# Patient Record
Sex: Female | Born: 1990 | Race: White | Hispanic: No | Marital: Married | Smoking: Never smoker
Health system: Southern US, Community
[De-identification: ages and names within clinical notes are randomized; demographics above are authoritative.]

## PROBLEM LIST (undated history)

## (undated) ENCOUNTER — Inpatient Hospital Stay (HOSPITAL_COMMUNITY): Payer: Self-pay

## (undated) DIAGNOSIS — R011 Cardiac murmur, unspecified: Secondary | ICD-10-CM

## (undated) DIAGNOSIS — Z789 Other specified health status: Secondary | ICD-10-CM

## (undated) HISTORY — PX: WISDOM TOOTH EXTRACTION: SHX21

## (undated) HISTORY — PX: NO PAST SURGERIES: SHX2092

## (undated) HISTORY — DX: Other specified health status: Z78.9

---
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
## 2015-01-26 ENCOUNTER — Ambulatory Visit (INDEPENDENT_AMBULATORY_CARE_PROVIDER_SITE_OTHER): Payer: BC Managed Care – PPO | Admitting: Obstetrics & Gynecology

## 2015-01-26 ENCOUNTER — Encounter: Payer: Self-pay | Admitting: Obstetrics & Gynecology

## 2015-01-26 VITALS — BP 115/75 | HR 77 | Wt 166.0 lb

## 2015-01-26 DIAGNOSIS — Z36 Encounter for antenatal screening of mother: Secondary | ICD-10-CM | POA: Diagnosis not present

## 2015-01-26 DIAGNOSIS — Z3491 Encounter for supervision of normal pregnancy, unspecified, first trimester: Secondary | ICD-10-CM

## 2015-01-26 DIAGNOSIS — Z3480 Encounter for supervision of other normal pregnancy, unspecified trimester: Secondary | ICD-10-CM | POA: Insufficient documentation

## 2015-01-26 DIAGNOSIS — Z3401 Encounter for supervision of normal first pregnancy, first trimester: Secondary | ICD-10-CM | POA: Diagnosis not present

## 2015-01-26 DIAGNOSIS — Z3490 Encounter for supervision of normal pregnancy, unspecified, unspecified trimester: Secondary | ICD-10-CM

## 2015-01-26 LAB — OB RESULTS CONSOLE GC/CHLAMYDIA
Chlamydia: NEGATIVE
GC PROBE AMP, GENITAL: NEGATIVE

## 2015-01-26 LAB — OB RESULTS CONSOLE RPR: RPR: NONREACTIVE

## 2015-01-26 LAB — OB RESULTS CONSOLE ABO/RH: RH Type: POSITIVE

## 2015-01-26 LAB — OB RESULTS CONSOLE RUBELLA ANTIBODY, IGM: Rubella: IMMUNE

## 2015-01-26 LAB — OB RESULTS CONSOLE ANTIBODY SCREEN: Antibody Screen: NEGATIVE

## 2015-01-26 LAB — OB RESULTS CONSOLE PLATELET COUNT: Platelets: 239 10*3/uL

## 2015-01-26 LAB — OB RESULTS CONSOLE HIV ANTIBODY (ROUTINE TESTING): HIV: NONREACTIVE

## 2015-01-26 LAB — OB RESULTS CONSOLE HEPATITIS B SURFACE ANTIGEN: HEP B S AG: NEGATIVE

## 2015-01-26 LAB — OB RESULTS CONSOLE HGB/HCT, BLOOD: HEMOGLOBIN: 12 g/dL

## 2015-01-26 NOTE — Progress Notes (Signed)
Bedside US today measures [redacted]w[redacted]d fetus with heartbeat.

## 2015-01-26 NOTE — Progress Notes (Signed)
   Subjective:    Kimberly Randolph is a MW G1P0 [redacted]w[redacted]d being seen today for her first obstetrical visit.  Her obstetrical history is significant for none. Patient does intend to breast feed. Pregnancy history fully reviewed.  Patient reports no complaints.  Filed Vitals:   01/26/15 1525  BP: 115/75  Pulse: 77  Weight: 166 lb (75.297 kg)    HISTORY: OB History  Gravida Para Term Preterm AB SAB TAB Ectopic Multiple Living  1             # Outcome Date GA Lbr Len/2nd Weight Sex Delivery Anes PTL Lv  1 Current              Past Medical History  Diagnosis Date  . Medical history non-contributory    Past Surgical History  Procedure Laterality Date  . Wisdom tooth extraction     Family History  Problem Relation Age of Onset  . Cancer Mother 56    Colon Cancer  . Cancer Father 72    Bladder  . Diabetes Father     Type 2  . Hypertension Father   . Birth defects Sister infancy    Heart valve defect  . Diabetes Paternal Grandmother   . Cancer Paternal Grandfather     Pancreatic     Exam    Uterus:     Pelvic Exam:    Perineum: No Hemorrhoids   Vulva: normal   Vagina:  normal mucosa   pH:    Cervix: anteverted   Adnexa: normal adnexa   Bony Pelvis: android  System: Breast:  normal appearance, no masses or tenderness   Skin: normal coloration and turgor, no rashes    Neurologic: oriented   Extremities: normal strength, tone, and muscle mass   HEENT PERRLA   Mouth/Teeth mucous membranes moist, pharynx normal without lesions   Neck supple   Cardiovascular: regular rate and rhythm   Respiratory:  appears well, vitals normal, no respiratory distress, acyanotic, normal RR, ear and throat exam is normal, neck free of mass or lymphadenopathy, chest clear, no wheezing, crepitations, rhonchi, normal symmetric air entry   Abdomen: soft, non-tender; bowel sounds normal; no masses,  no organomegaly   Urinary: urethral meatus normal      Assessment:    Pregnancy:  G1P0 There are no active problems to display for this patient.       Plan:     Initial labs drawn. Prenatal vitamins. Problem list reviewed and updated. Genetic Screening discussed First Screen: declined.  Ultrasound discussed; fetal survey: requested.  Follow up in 4 weeks. She will join [employer] She may choose to have a Quad screen Kimberly Randolph C. 01/26/2015

## 2015-01-27 LAB — CULTURE, OB URINE
COLONY COUNT: NO GROWTH
Organism ID, Bacteria: NO GROWTH

## 2015-01-27 LAB — GC/CHLAMYDIA PROBE AMP
CT Probe RNA: NEGATIVE
GC Probe RNA: NEGATIVE

## 2015-01-28 ENCOUNTER — Telehealth: Payer: Self-pay | Admitting: *Deleted

## 2015-01-28 DIAGNOSIS — Z3491 Encounter for supervision of normal pregnancy, unspecified, first trimester: Secondary | ICD-10-CM

## 2015-01-28 NOTE — Telephone Encounter (Signed)
Updated problem list

## 2015-02-17 ENCOUNTER — Ambulatory Visit (INDEPENDENT_AMBULATORY_CARE_PROVIDER_SITE_OTHER): Payer: BC Managed Care – PPO | Admitting: Obstetrics and Gynecology

## 2015-02-17 ENCOUNTER — Encounter: Payer: Self-pay | Admitting: Obstetrics and Gynecology

## 2015-02-17 ENCOUNTER — Encounter: Payer: Self-pay | Admitting: *Deleted

## 2015-02-17 VITALS — BP 108/67 | HR 74 | Wt 166.0 lb

## 2015-02-17 DIAGNOSIS — Z23 Encounter for immunization: Secondary | ICD-10-CM | POA: Diagnosis not present

## 2015-02-17 DIAGNOSIS — Z3401 Encounter for supervision of normal first pregnancy, first trimester: Secondary | ICD-10-CM

## 2015-02-17 DIAGNOSIS — Z3491 Encounter for supervision of normal pregnancy, unspecified, first trimester: Secondary | ICD-10-CM

## 2015-02-17 NOTE — Patient Instructions (Signed)
Second Trimester of Pregnancy The second trimester is from week 13 through week 28, months 4 through 6. The second trimester is often a time when you feel your best. Your body has also adjusted to being pregnant, and you begin to feel better physically. Usually, morning sickness has lessened or quit completely, you may have more energy, and you may have an increase in appetite. The second trimester is also a time when the fetus is growing rapidly. At the end of the sixth month, the fetus is about 9 inches long and weighs about 1 pounds. You will likely begin to feel the baby move (quickening) between 18 and 20 weeks of the pregnancy. BODY CHANGES Your body goes through many changes during pregnancy. The changes vary from woman to woman.   Your weight will continue to increase. You will notice your lower abdomen bulging out.  You may begin to get stretch marks on your hips, abdomen, and breasts.  You may develop headaches that can be relieved by medicines approved by your health care provider.  You may urinate more often because the fetus is pressing on your bladder.  You may develop or continue to have heartburn as a result of your pregnancy.  You may develop constipation because certain hormones are causing the muscles that push waste through your intestines to slow down.  You may develop hemorrhoids or swollen, bulging veins (varicose veins).  You may have back pain because of the weight gain and pregnancy hormones relaxing your joints between the bones in your pelvis and as a result of a shift in weight and the muscles that support your balance.  Your breasts will continue to grow and be tender.  Your gums may bleed and may be sensitive to brushing and flossing.  Dark spots or blotches (chloasma, mask of pregnancy) may develop on your face. This will likely fade after the baby is born.  A dark line from your belly button to the pubic area (linea nigra) may appear. This will likely fade  after the baby is born.  You may have changes in your hair. These can include thickening of your hair, rapid growth, and changes in texture. Some women also have hair loss during or after pregnancy, or hair that feels dry or thin. Your hair will most likely return to normal after your baby is born. WHAT TO EXPECT AT YOUR PRENATAL VISITS During a routine prenatal visit:  You will be weighed to make sure you and the fetus are growing normally.  Your blood pressure will be taken.  Your abdomen will be measured to track your baby's growth.  The fetal heartbeat will be listened to.  Any test results from the previous visit will be discussed. Your health care provider may ask you:  How you are feeling.  If you are feeling the baby move.  If you have had any abnormal symptoms, such as leaking fluid, bleeding, severe headaches, or abdominal cramping.  If you have any questions. Other tests that may be performed during your second trimester include:  Blood tests that check for:  Low iron levels (anemia).  Gestational diabetes (between 24 and 28 weeks).  Rh antibodies.  Urine tests to check for infections, diabetes, or protein in the urine.  An ultrasound to confirm the proper growth and development of the baby.  An amniocentesis to check for possible genetic problems.  Fetal screens for spina bifida and Down syndrome. HOME CARE INSTRUCTIONS   Avoid all smoking, herbs, alcohol, and unprescribed   drugs. These chemicals affect the formation and growth of the baby.  Follow your health care provider's instructions regarding medicine use. There are medicines that are either safe or unsafe to take during pregnancy.  Exercise only as directed by your health care provider. Experiencing uterine cramps is a good sign to stop exercising.  Continue to eat regular, healthy meals.  Wear a good support bra for breast tenderness.  Do not use hot tubs, steam rooms, or saunas.  Wear your  seat belt at all times when driving.  Avoid raw meat, uncooked cheese, cat litter boxes, and soil used by cats. These carry germs that can cause birth defects in the baby.  Take your prenatal vitamins.  Try taking a stool softener (if your health care provider approves) if you develop constipation. Eat more high-fiber foods, such as fresh vegetables or fruit and whole grains. Drink plenty of fluids to keep your urine clear or pale yellow.  Take warm sitz baths to soothe any pain or discomfort caused by hemorrhoids. Use hemorrhoid cream if your health care provider approves.  If you develop varicose veins, wear support hose. Elevate your feet for 15 minutes, 3-4 times a day. Limit salt in your diet.  Avoid heavy lifting, wear low heel shoes, and practice good posture.  Rest with your legs elevated if you have leg cramps or low back pain.  Visit your dentist if you have not gone yet during your pregnancy. Use a soft toothbrush to brush your teeth and be gentle when you floss.  A sexual relationship may be continued unless your health care provider directs you otherwise.  Continue to go to all your prenatal visits as directed by your health care provider. SEEK MEDICAL CARE IF:   You have dizziness.  You have mild pelvic cramps, pelvic pressure, or nagging pain in the abdominal area.  You have persistent nausea, vomiting, or diarrhea.  You have a bad smelling vaginal discharge.  You have pain with urination. SEEK IMMEDIATE MEDICAL CARE IF:   You have a fever.  You are leaking fluid from your vagina.  You have spotting or bleeding from your vagina.  You have severe abdominal cramping or pain.  You have rapid weight gain or loss.  You have shortness of breath with chest pain.  You notice sudden or extreme swelling of your face, hands, ankles, feet, or legs.  You have not felt your baby move in over an hour.  You have severe headaches that do not go away with  medicine.  You have vision changes. Document Released: 05/23/2001 Document Revised: 06/03/2013 Document Reviewed: 07/30/2012 Exit[employer] Patient Information 2015 [employer]. This information is not intended to replace advice given to you by your health care provider. Make sure you discuss any questions you have with your health care provider.  

## 2015-02-17 NOTE — Progress Notes (Signed)
Subjective:  Kimberly Randolph is a 24 y.o. G1P0 at [redacted]w[redacted]d being seen today for ongoing prenatal care.  Patient reports no complaints. Some nausea and fatigue only.  Contractions: Not present.  Vag. Bleeding: None. Movement: Absent. Denies leaking of fluid.   The following portions of the patient's history were reviewed and updated as appropriate: allergies, current medications, past family history, past medical history, past social history, past surgical history and problem list. PN lab results not back> RN to locate.   Objective:   Filed Vitals:   02/17/15 1318  BP: 108/67  Pulse: 74  Weight: 166 lb (75.297 kg)    Fetal Status: Fetal Heart Rate (bpm): 172   Movement: Absent     General:  Alert, oriented and cooperative. Patient is in no acute distress.  Skin: Skin is warm and dry. No rash noted.   Cardiovascular: Normal heart rate noted  Respiratory: Normal respiratory effort, no problems with respiration noted  Abdomen: Soft, gravid, appropriate for gestational age. Pain/Pressure: Absent     Pelvic: Vag. Bleeding: None Vag D/C Character: Thin   Cervical exam deferred        Extremities: Normal range of motion.  Edema: None  Mental Status: Normal mood and affect. Normal behavior. Normal judgment and thought content.   Urinalysis: Urine Protein: 1+ Urine Glucose: Negative  Assessment and Plan:  Pregnancy: G1P0 at [redacted]w[redacted]d  1. 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 Common discomforts, nausea discussed. General obstetric precautions including but not limited to vaginal bleeding, contractions, leaking of fluid and fetal movement were reviewed in detail with the patient. Please refer to After Visit Summary for other counseling recommendations.  Schedule anatomic scan Return in about 1 month (around 03/19/2015).   Danae Orleans, CNM

## 2015-03-10 ENCOUNTER — Telehealth: Payer: Self-pay | Admitting: *Deleted

## 2015-03-10 NOTE — Telephone Encounter (Signed)
-----   Message from Pennie Banter sent at 03/10/2015  1:12 PM EDT ----- Regarding: nausea Rx request Patient is still having nausea and would like to try an Rx that will help with this.

## 2015-03-10 NOTE — Telephone Encounter (Signed)
Pt continues to experience nausea, would like to start medication, informed her of the options for Diclegis and Phenergan.  Pt would like to try Diclegis, will come to office and pick up samples to try and if medication works well we will call in rx to pharmacy.

## 2015-03-10 NOTE — Telephone Encounter (Signed)
Called pt, no answer, left message to call office.

## 2015-03-25 ENCOUNTER — Encounter: Payer: Self-pay | Admitting: *Deleted

## 2015-04-07 ENCOUNTER — Other Ambulatory Visit: Payer: Self-pay | Admitting: Obstetrics and Gynecology

## 2015-04-07 ENCOUNTER — Ambulatory Visit (HOSPITAL_COMMUNITY)
Admission: RE | Admit: 2015-04-07 | Discharge: 2015-04-07 | Disposition: A | Discharge status: Home / Self Care | Payer: BC Managed Care – PPO | Source: Ambulatory Visit | Attending: Obstetrics and Gynecology | Admitting: Obstetrics and Gynecology

## 2015-04-07 DIAGNOSIS — Z3A19 19 weeks gestation of pregnancy: Secondary | ICD-10-CM

## 2015-04-07 DIAGNOSIS — Z3491 Encounter for supervision of normal pregnancy, unspecified, first trimester: Secondary | ICD-10-CM

## 2015-04-07 DIAGNOSIS — Z3481 Encounter for supervision of other normal pregnancy, first trimester: Secondary | ICD-10-CM | POA: Insufficient documentation

## 2015-04-12 ENCOUNTER — Ambulatory Visit (INDEPENDENT_AMBULATORY_CARE_PROVIDER_SITE_OTHER): Payer: BC Managed Care – PPO | Admitting: Obstetrics & Gynecology

## 2015-04-12 VITALS — BP 121/71 | HR 78 | Wt 175.0 lb

## 2015-04-12 DIAGNOSIS — Z3402 Encounter for supervision of normal first pregnancy, second trimester: Secondary | ICD-10-CM

## 2015-04-12 DIAGNOSIS — Z3492 Encounter for supervision of normal pregnancy, unspecified, second trimester: Secondary | ICD-10-CM

## 2015-04-12 NOTE — Patient Instructions (Addendum)
Return to clinic for any obstetric concerns or go to [facility] for evaluation Contraception Choices Contraception (birth control) is the use of any methods or devices to prevent pregnancy. Below are some methods to help avoid pregnancy. HORMONAL METHODS   Contraceptive implant. This is a thin, plastic tube containing progesterone hormone. It does not contain estrogen hormone. Your health care provider inserts the tube in the inner part of the upper arm. The tube can remain in place for up to 3 years. After 3 years, the implant must be removed. The implant prevents the ovaries from releasing an egg (ovulation), thickens the cervical mucus to prevent sperm from entering the uterus, and thins the lining of the inside of the uterus.  Progesterone-only injections. These injections are given every 3 months by your health care provider to prevent pregnancy. This synthetic progesterone hormone stops the ovaries from releasing eggs. It also thickens cervical mucus and changes the uterine lining. This makes it harder for sperm to survive in the uterus.  Birth control pills. These pills contain estrogen and progesterone hormone. They work by preventing the ovaries from releasing eggs (ovulation). They also cause the cervical mucus to thicken, preventing the sperm from entering the uterus. Birth control pills are prescribed by a health care provider.Birth control pills can also be used to treat heavy periods.  Minipill. This type of birth control pill contains only the progesterone hormone. They are taken every day of each month and must be prescribed by your health care provider.  Birth control patch. The patch contains hormones similar to those in birth control pills. It must be changed once a week and is prescribed by a health care provider.  Vaginal ring. The ring contains hormones similar to those in birth control pills. It is left in the vagina for 3 weeks, removed for 1 week, and then a new one is put back in  place. The patient must be comfortable inserting and removing the ring from the vagina.A health care provider's prescription is necessary.  Emergency contraception. Emergency contraceptives prevent pregnancy after unprotected sexual intercourse. This pill can be taken right after sex or up to 5 days after unprotected sex. It is most effective the sooner you take the pills after having sexual intercourse. Most emergency contraceptive pills are available without a prescription. Check with your pharmacist. Do not use emergency contraception as your only form of birth control. BARRIER METHODS   Female condom. This is a thin sheath (latex or rubber) that is worn over the penis during sexual intercourse. It can be used with spermicide to increase effectiveness.  Female condom. This is a soft, loose-fitting sheath that is put into the vagina before sexual intercourse.  Diaphragm. This is a soft, latex, dome-shaped barrier that must be fitted by a health care provider. It is inserted into the vagina, along with a spermicidal jelly. It is inserted before intercourse. The diaphragm should be left in the vagina for 6 to 8 hours after intercourse.  Cervical cap. This is a round, soft, latex or plastic cup that fits over the cervix and must be fitted by a health care provider. The cap can be left in place for up to 48 hours after intercourse.  Sponge. This is a soft, circular piece of polyurethane foam. The sponge has spermicide in it. It is inserted into the vagina after wetting it and before sexual intercourse.  Spermicides. These are chemicals that kill or block sperm from entering the cervix and uterus. They come in the  form of creams, jellies, suppositories, foam, or tablets. They do not require a prescription. They are inserted into the vagina with an applicator before having sexual intercourse. The process must be repeated every time you have sexual intercourse. INTRAUTERINE CONTRACEPTION  Intrauterine  device (IUD). This is a T-shaped device that is put in a woman's uterus during a menstrual period to prevent pregnancy. There are 2 types:  Copper IUD. This type of IUD is wrapped in copper wire and is placed inside the uterus. Copper makes the uterus and fallopian tubes produce a fluid that kills sperm. It can stay in place for 10 years.  Hormone IUD. This type of IUD contains the hormone progestin (synthetic progesterone). The hormone thickens the cervical mucus and prevents sperm from entering the uterus, and it also thins the uterine lining to prevent implantation of a fertilized egg. The hormone can weaken or kill the sperm that get into the uterus. It can stay in place for 3-5 years, depending on which type of IUD is used. PERMANENT METHODS OF CONTRACEPTION  Female tubal ligation. This is when the woman's fallopian tubes are surgically sealed, tied, or blocked to prevent the egg from traveling to the uterus.  Hysteroscopic sterilization. This involves placing a small coil or insert into each fallopian tube. Your doctor uses a technique called hysteroscopy to do the procedure. The device causes scar tissue to form. This results in permanent blockage of the fallopian tubes, so the sperm cannot fertilize the egg. It takes about 3 months after the procedure for the tubes to become blocked. You must use another form of birth control for these 3 months.  Female sterilization. This is when the female has the tubes that carry sperm tied off (vasectomy).This blocks sperm from entering the vagina during sexual intercourse. After the procedure, the man can still ejaculate fluid (semen). NATURAL PLANNING METHODS  Natural family planning. This is not having sexual intercourse or using a barrier method (condom, diaphragm, cervical cap) on days the woman could become pregnant.  Calendar method. This is keeping track of the length of each menstrual cycle and identifying when you are fertile.  Ovulation method.  This is avoiding sexual intercourse during ovulation.  Symptothermal method. This is avoiding sexual intercourse during ovulation, using a thermometer and ovulation symptoms.  Post-ovulation method. This is timing sexual intercourse after you have ovulated. Regardless of which type or method of contraception you choose, it is important that you use condoms to protect against the transmission of sexually transmitted infections (STIs). Talk with your health care provider about which form of contraception is most appropriate for you.   This information is not intended to replace advice given to you by your health care provider. Make sure you discuss any questions you have with your health care provider.   Document Released: 05/29/2005 Document Revised: 06/03/2013 Document Reviewed: 11/21/2012 Elsevier Interactive Patient Education [employer].

## 2015-04-12 NOTE — Progress Notes (Signed)
Subjective:  Kimberly Randolph is a 24 y.o. G1P0 at 7693w0d being seen today for ongoing prenatal care.  Patient reports no complaints.  Contractions: Not present.  Vag. Bleeding: None. Movement: Present. Denies leaking of fluid.   The following portions of the patient's history were reviewed and updated as appropriate: allergies, current medications, past family history, past medical history, past social history, past surgical history and problem list. Problem list updated.  Objective:   Filed Vitals:   04/12/15 1413  BP: 121/71  Pulse: 78  Weight: 175 lb (79.379 kg)    Fetal Status: Fetal Heart Rate (bpm): 142 Fundal Height: 20 cm Movement: Present     General:  Alert, oriented and cooperative. Patient is in no acute distress.  Skin: Skin is warm and dry. No rash noted.   Cardiovascular: Normal heart rate noted  Respiratory: Normal respiratory effort, no problems with respiration noted  Abdomen: Soft, gravid, appropriate for gestational age. Pain/Pressure: Absent     Pelvic: Vag. Bleeding: None Vag D/C Character: Thin   Cervical exam deferred        Extremities: Normal range of motion.  Edema: None  Mental Status: Normal mood and affect. Normal behavior. Normal judgment and thought content.   Urinalysis: Urine Protein: Negative Urine Glucose: Negative  Assessment and Plan:  Pregnancy: G1P0 at 7293w0d  Supervision of normal pregnancy, second trimester Preterm labor symptoms and general obstetric precautions including but not limited to vaginal bleeding, contractions, leaking of fluid and fetal movement were reviewed in detail with the patient. Please refer to After Visit Summary for other counseling recommendations.  Return in about 8 weeks (around 06/07/2015) for 1 hr GTT, TDap, 3rd trimester labs, OB Visit (Babyscripts).   Tereso NewcomerUgonna A Theressa Piedra, MD

## 2015-04-15 ENCOUNTER — Encounter: Payer: BC Managed Care – PPO | Admitting: Obstetrics & Gynecology

## 2015-06-08 ENCOUNTER — Encounter: Payer: Self-pay | Admitting: Obstetrics & Gynecology

## 2015-06-08 ENCOUNTER — Ambulatory Visit (INDEPENDENT_AMBULATORY_CARE_PROVIDER_SITE_OTHER): Payer: BC Managed Care – PPO | Admitting: Obstetrics & Gynecology

## 2015-06-08 VITALS — BP 112/74 | HR 97 | Wt 191.0 lb

## 2015-06-08 DIAGNOSIS — Z23 Encounter for immunization: Secondary | ICD-10-CM | POA: Diagnosis not present

## 2015-06-08 DIAGNOSIS — Z3403 Encounter for supervision of normal first pregnancy, third trimester: Secondary | ICD-10-CM

## 2015-06-08 DIAGNOSIS — Z36 Encounter for antenatal screening of mother: Secondary | ICD-10-CM | POA: Diagnosis not present

## 2015-06-08 DIAGNOSIS — Z3493 Encounter for supervision of normal pregnancy, unspecified, third trimester: Secondary | ICD-10-CM

## 2015-06-08 LAB — CBC
HEMATOCRIT: 35.5 % — AB (ref 36.0–46.0)
HEMOGLOBIN: 12 g/dL (ref 12.0–15.0)
MCH: 31.1 pg (ref 26.0–34.0)
MCHC: 33.8 g/dL (ref 30.0–36.0)
MCV: 92 fL (ref 78.0–100.0)
MPV: 10.3 fL (ref 8.6–12.4)
Platelets: 213 10*3/uL (ref 150–400)
RBC: 3.86 MIL/uL — ABNORMAL LOW (ref 3.87–5.11)
RDW: 13.6 % (ref 11.5–15.5)
WBC: 11 10*3/uL — AB (ref 4.0–10.5)

## 2015-06-08 NOTE — Progress Notes (Signed)
Subjective:  Kimberly JuniperKatie Randolph is a 24 y.o. G1P0 at 6766w1d being seen today for ongoing prenatal care.  She is currently monitored for the following issues for this low-risk pregnancy and has Supervision of normal pregnancy on her problem list.  Patient reports no complaints.  Contractions: Not present. Vag. Bleeding: None.  Movement: Present. Denies leaking of fluid.   The following portions of the patient's history were reviewed and updated as appropriate: allergies, current medications, past family history, past medical history, past social history, past surgical history and problem list. Problem list updated.  Objective:   Filed Vitals:   06/08/15 1049  BP: 112/74  Pulse: 97  Weight: 191 lb (86.637 kg)    Fetal Status: Fetal Heart Rate (bpm): 146   Movement: Present     General:  Alert, oriented and cooperative. Patient is in no acute distress.  Skin: Skin is warm and dry. No rash noted.   Cardiovascular: Normal heart rate noted  Respiratory: Normal respiratory effort, no problems with respiration noted  Abdomen: Soft, gravid, appropriate for gestational age. Pain/Pressure: Absent     Pelvic: Vag. Bleeding: None Vag D/C Character: Thin   Cervical exam deferred        Extremities: Normal range of motion.  Edema: None  Mental Status: Normal mood and affect. Normal behavior. Normal judgment and thought content.   Urinalysis: Urine Protein: Negative Urine Glucose: Negative  Assessment and Plan:  Pregnancy: G1P0 at 3766w1d  1. Supervision of normal pregnancy, third trimester  - Glucose Tolerance, 1 HR (50g) w/o Fasting - HIV antibody (with reflex) - RPR - CBC - Tdap vaccine greater than or equal to 7yo IM  Preterm labor symptoms and general obstetric precautions including but not limited to vaginal bleeding, contractions, leaking of fluid and fetal movement were reviewed in detail with the patient. Please refer to After Visit Summary for other counseling recommendations.  Return in  about 4 weeks (around 07/06/2015).   Allie BossierMyra C Chania Kochanski, MD

## 2015-06-09 LAB — GLUCOSE TOLERANCE, 1 HOUR (50G) W/O FASTING: Glucose, 1 Hour GTT: 98 mg/dL (ref 70–140)

## 2015-06-09 LAB — HIV ANTIBODY (ROUTINE TESTING W REFLEX): HIV 1&2 Ab, 4th Generation: NONREACTIVE

## 2015-06-09 LAB — RPR

## 2015-06-13 NOTE — L&D Delivery Note (Addendum)
.  Operative Delivery Note  Pt had inadequate uterine activity but progressed to C/P.  Her maternal expulsive efforts however were very suboptimal.  As a result we opted to have pt labor down spontaneously Still poor effort so began 2nd stage pitocin and increaed up to 11 mu Pushing efforts imp-roved but pt definitely had exhaustion Bell vacuum was placed at +4 station MLE made, had a very tight band and poor vaginal pliability  With 2 expulsive efforts delivered  At 11:40 PM a viable female was delivered via , Vacuum Assisted.  Presentation: vertex; Position: occiput anterior; Station: +4.  Verbal consent: obtained from patient.  Risks and benefits discussed in detail.  Risks include, but are not limited to the risks of anesthesia, bleeding, infection, damage to maternal tissues, fetal cephalhematoma.  There is also the risk of inability to effect vaginal delivery of the head, or shoulder dystocia that cannot be resolved by established maneuvers, leading to the need for emergency cesarean section.  APGAR: 9, 9; weight 8 lb 9 oz (3884 g).   Placenta status: Intact, Spontaneous.   Cord: 3 vessels with the following complications: None.  Cord pH: not done  Anesthesia: Epidural  Instruments:  Episiotomy: Median Lacerations: 4th degree Suture Repair: 3.0 monocryl Est. Blood Loss (mL): 500  Mom to postpartum.  Baby to Couplet care / Skin to Skin.  Ever Gustafson H 08/26/2015, 1:16 AM

## 2015-07-05 ENCOUNTER — Encounter: Payer: Self-pay | Admitting: *Deleted

## 2015-07-05 ENCOUNTER — Ambulatory Visit (INDEPENDENT_AMBULATORY_CARE_PROVIDER_SITE_OTHER): Payer: BC Managed Care – PPO | Admitting: Family Medicine

## 2015-07-05 VITALS — BP 100/69 | HR 93 | Ht 69.0 in | Wt 197.0 lb

## 2015-07-05 DIAGNOSIS — Z3403 Encounter for supervision of normal first pregnancy, third trimester: Secondary | ICD-10-CM

## 2015-07-05 DIAGNOSIS — Z3493 Encounter for supervision of normal pregnancy, unspecified, third trimester: Secondary | ICD-10-CM

## 2015-07-05 NOTE — Progress Notes (Signed)
1/14 BP on babyscripts 107/71. Patient is compliant.

## 2015-07-05 NOTE — Progress Notes (Signed)
Subjective:  Kimberly Randolph is a 25 y.o. G1P0 at [redacted]w[redacted]d being seen today for ongoing prenatal care.  She is currently monitored for the following issues for this low-risk pregnancy and has Supervision of normal pregnancy on her problem list.  Patient reports no complaints.  Contractions: Irregular. Vag. Bleeding: None.  Movement: Present. Denies leaking of fluid.   The following portions of the patient's history were reviewed and updated as appropriate: allergies, current medications, past family history, past medical history, past social history, past surgical history and problem list. Problem list updated.  Objective:   Filed Vitals:   07/05/15 1506 07/05/15 1509  BP: 100/69   Pulse: 93   Height:   (1.753 m)  Weight: 197 lb (89.359 kg)     Fetal Status: Fetal Heart Rate (bpm): 142 Fundal Height: 32 cm Movement: Present     General:  Alert, oriented and cooperative. Patient is in no acute distress.  Skin: Skin is warm and dry. No rash noted.   Cardiovascular: Normal heart rate noted  Respiratory: Normal respiratory effort, no problems with respiration noted  Abdomen: Soft, gravid, appropriate for gestational age. Pain/Pressure: Present     Pelvic: Vag. Bleeding: None     Cervical exam deferred        Extremities: Normal range of motion.  Edema: Trace  Mental Status: Normal mood and affect. Normal behavior. Normal judgment and thought content.   Urinalysis: Urine Protein: Negative Urine Glucose: Negative  Assessment and Plan:  Pregnancy: G1P0 at [redacted]w[redacted]d  1. Supervision of normal pregnancy, third trimester Continue routine prenatal care.   Preterm labor symptoms and general obstetric precautions including but not limited to vaginal bleeding, contractions, leaking of fluid and fetal movement were reviewed in detail with the patient. Please refer to After Visit Summary for other counseling recommendations.  Return in about 4 weeks (around 08/02/2015).   Reva Bores, MD

## 2015-07-05 NOTE — Patient Instructions (Signed)
Third Trimester of Pregnancy The third trimester is from week 29 through week 42, months 7 through 9. The third trimester is a time when the fetus is growing rapidly. At the end of the ninth month, the fetus is about 20 inches in length and weighs 6-10 pounds.  BODY CHANGES Your body goes through many changes during pregnancy. The changes vary from woman to woman.   Your weight will continue to increase. You can expect to gain 25-35 pounds (11-16 kg) by the end of the pregnancy.  You may begin to get stretch marks on your hips, abdomen, and breasts.  You may urinate more often because the fetus is moving lower into your pelvis and pressing on your bladder.  You may develop or continue to have heartburn as a result of your pregnancy.  You may develop constipation because certain hormones are causing the muscles that push waste through your intestines to slow down.  You may develop hemorrhoids or swollen, bulging veins (varicose veins).  You may have pelvic pain because of the weight gain and pregnancy hormones relaxing your joints between the bones in your pelvis. Backaches may result from overexertion of the muscles supporting your posture.  You may have changes in your hair. These can include thickening of your hair, rapid growth, and changes in texture. Some women also have hair loss during or after pregnancy, or hair that feels dry or thin. Your hair will most likely return to normal after your baby is born.  Your breasts will continue to grow and be tender. A yellow discharge may leak from your breasts called colostrum.  Your belly button may stick out.  You may feel short of breath because of your expanding uterus.  You may notice the fetus "dropping," or moving lower in your abdomen.  You may have a bloody mucus discharge. This usually occurs a few days to a week before labor begins.  Your cervix becomes thin and soft (effaced) near your due date. WHAT TO EXPECT AT YOUR  PRENATAL EXAMS  You will have prenatal exams every 2 weeks until week 36. Then, you will have weekly prenatal exams. During a routine prenatal visit:  You will be weighed to make sure you and the fetus are growing normally.  Your blood pressure is taken.  Your abdomen will be measured to track your baby's growth.  The fetal heartbeat will be listened to.  Any test results from the previous visit will be discussed.  You may have a cervical check near your due date to see if you have effaced. At around 36 weeks, your caregiver will check your cervix. At the same time, your caregiver will also perform a test on the secretions of the vaginal tissue. This test is to determine if a type of bacteria, Group B streptococcus, is present. Your caregiver will explain this further. Your caregiver may ask you:  What your birth plan is.  How you are feeling.  If you are feeling the baby move.  If you have had any abnormal symptoms, such as leaking fluid, bleeding, severe headaches, or abdominal cramping.  If you are using any tobacco products, including cigarettes, chewing tobacco, and electronic cigarettes.  If you have any questions. Other tests or screenings that may be performed during your third trimester include:  Blood tests that check for low iron levels (anemia).  Fetal testing to check the health, activity level, and growth of the fetus. Testing is done if you have certain medical conditions or if   there are problems during the pregnancy.  HIV (human immunodeficiency virus) testing. If you are at high risk, you may be screened for HIV during your third trimester of pregnancy. FALSE LABOR You may feel small, irregular contractions that eventually go away. These are called Braxton Hicks contractions, or false labor. Contractions may last for hours, days, or even weeks before true labor sets in. If contractions come at regular intervals, intensify, or become painful, it is best to be seen  by your caregiver.  SIGNS OF LABOR   Menstrual-like cramps.  Contractions that are 5 minutes apart or less.  Contractions that start on the top of the uterus and spread down to the lower abdomen and back.  A sense of increased pelvic pressure or back pain.  A watery or bloody mucus discharge that comes from the vagina. If you have any of these signs before the 37th week of pregnancy, call your caregiver right away. You need to go to the hospital to get checked immediately. HOME CARE INSTRUCTIONS   Avoid all smoking, herbs, alcohol, and unprescribed drugs. These chemicals affect the formation and growth of the baby.  Do not use any tobacco products, including cigarettes, chewing tobacco, and electronic cigarettes. If you need help quitting, ask your health care provider. You may receive counseling support and other resources to help you quit.  Follow your caregiver's instructions regarding medicine use. There are medicines that are either safe or unsafe to take during pregnancy.  Exercise only as directed by your caregiver. Experiencing uterine cramps is a good sign to stop exercising.  Continue to eat regular, healthy meals.  Wear a good support bra for breast tenderness.  Do not use hot tubs, steam rooms, or saunas.  Wear your seat belt at all times when driving.  Avoid raw meat, uncooked cheese, cat litter boxes, and soil used by cats. These carry germs that can cause birth defects in the baby.  Take your prenatal vitamins.  Take 1500-2000 mg of calcium daily starting at the 20th week of pregnancy until you deliver your baby.  Try taking a stool softener (if your caregiver approves) if you develop constipation. Eat more high-fiber foods, such as fresh vegetables or fruit and whole grains. Drink plenty of fluids to keep your urine clear or pale yellow.  Take warm sitz baths to soothe any pain or discomfort caused by hemorrhoids. Use hemorrhoid cream if your caregiver  approves.  If you develop varicose veins, wear support hose. Elevate your feet for 15 minutes, 3-4 times a day. Limit salt in your diet.  Avoid heavy lifting, wear low heal shoes, and practice good posture.  Rest a lot with your legs elevated if you have leg cramps or low back pain.  Visit your dentist if you have not gone during your pregnancy. Use a soft toothbrush to brush your teeth and be gentle when you floss.  A sexual relationship may be continued unless your caregiver directs you otherwise.  Do not travel far distances unless it is absolutely necessary and only with the approval of your caregiver.  Take prenatal classes to understand, practice, and ask questions about the labor and delivery.  Make a trial run to the hospital.  Pack your hospital bag.  Prepare the baby's nursery.  Continue to go to all your prenatal visits as directed by your caregiver. SEEK MEDICAL CARE IF:  You are unsure if you are in labor or if your water has broken.  You have dizziness.  You have   mild pelvic cramps, pelvic pressure, or nagging pain in your abdominal area.  You have persistent nausea, vomiting, or diarrhea.  You have a bad smelling vaginal discharge.  You have pain with urination. SEEK IMMEDIATE MEDICAL CARE IF:   You have a fever.  You are leaking fluid from your vagina.  You have spotting or bleeding from your vagina.  You have severe abdominal cramping or pain.  You have rapid weight loss or gain.  You have shortness of breath with chest pain.  You notice sudden or extreme swelling of your face, hands, ankles, feet, or legs.  You have not felt your baby move in over an hour.  You have severe headaches that do not go away with medicine.  You have vision changes.   This information is not intended to replace advice given to you by your health care provider. Make sure you discuss any questions you have with your health care provider.   Document Released:  05/23/2001 Document Revised: 06/19/2014 Document Reviewed: 07/30/2012 Elsevier Interactive Patient Education 2016 [employer]  Breastfeeding Deciding to breastfeed is one of the best choices you can make for you and your baby. A change in hormones during pregnancy causes your breast tissue to grow and increases the number and size of your milk ducts. These hormones also allow proteins, sugars, and fats from your blood supply to make breast milk in your milk-producing glands. Hormones prevent breast milk from being released before your baby is born as well as prompt milk flow after birth. Once breastfeeding has begun, thoughts of your baby, as well as his or her sucking or crying, can stimulate the release of milk from your milk-producing glands.  BENEFITS OF BREASTFEEDING For Your Baby  Your first milk (colostrum) helps your baby's digestive system function better.  There are antibodies in your milk that help your baby fight off infections.  Your baby has a lower incidence of asthma, allergies, and sudden infant death syndrome.  The nutrients in breast milk are better for your baby than infant formulas and are designed uniquely for your baby's needs.  Breast milk improves your baby's brain development.  Your baby is less likely to develop other conditions, such as childhood obesity, asthma, or type 2 diabetes mellitus. For You  Breastfeeding helps to create a very special bond between you and your baby.  Breastfeeding is convenient. Breast milk is always available at the correct temperature and costs nothing.  Breastfeeding helps to burn calories and helps you lose the weight gained during pregnancy.  Breastfeeding makes your uterus contract to its prepregnancy size faster and slows bleeding (lochia) after you give birth.   Breastfeeding helps to lower your risk of developing type 2 diabetes mellitus, osteoporosis, and breast or ovarian cancer later in life. SIGNS THAT YOUR BABY IS  HUNGRY Early Signs of Hunger  Increased alertness or activity.  Stretching.  Movement of the head from side to side.  Movement of the head and opening of the mouth when the corner of the mouth or cheek is stroked (rooting).  Increased sucking sounds, smacking lips, cooing, sighing, or squeaking.  Hand-to-mouth movements.  Increased sucking of fingers or hands. Late Signs of Hunger  Fussing.  Intermittent crying. Extreme Signs of Hunger Signs of extreme hunger will require calming and consoling before your baby will be able to breastfeed successfully. Do not wait for the following signs of extreme hunger to occur before you initiate breastfeeding:  Restlessness.  A loud, strong cry.  Screaming.   BREASTFEEDING BASICS Breastfeeding Initiation  Find a comfortable place to sit or lie down, with your neck and back well supported.  Place a pillow or rolled up blanket under your baby to bring him or her to the level of your breast (if you are seated). Nursing pillows are specially designed to help support your arms and your baby while you breastfeed.  Make sure that your baby's abdomen is facing your abdomen.  Gently massage your breast. With your fingertips, massage from your chest wall toward your nipple in a circular motion. This encourages milk flow. You may need to continue this action during the feeding if your milk flows slowly.  Support your breast with 4 fingers underneath and your thumb above your nipple. Make sure your fingers are well away from your nipple and your baby's mouth.  Stroke your baby's lips gently with your finger or nipple.  When your baby's mouth is open wide enough, quickly bring your baby to your breast, placing your entire nipple and as much of the colored area around your nipple (areola) as possible into your baby's mouth.  More areola should be visible above your baby's upper lip than below the lower lip.  Your baby's tongue should be between his  or her lower gum and your breast.  Ensure that your baby's mouth is correctly positioned around your nipple (latched). Your baby's lips should create a seal on your breast and be turned out (everted).  It is common for your baby to suck about 2-3 minutes in order to start the flow of breast milk. Latching Teaching your baby how to latch on to your breast properly is very important. An improper latch can cause nipple pain and decreased milk supply for you and poor weight gain in your baby. Also, if your baby is not latched onto your nipple properly, he or she may swallow some air during feeding. This can make your baby fussy. Burping your baby when you switch breasts during the feeding can help to get rid of the air. However, teaching your baby to latch on properly is still the best way to prevent fussiness from swallowing air while breastfeeding. Signs that your baby has successfully latched on to your nipple:  Silent tugging or silent sucking, without causing you pain.  Swallowing heard between every 3-4 sucks.  Muscle movement above and in front of his or her ears while sucking. Signs that your baby has not successfully latched on to nipple:  Sucking sounds or smacking sounds from your baby while breastfeeding.  Nipple pain. If you think your baby has not latched on correctly, slip your finger into the corner of your baby's mouth to break the suction and place it between your baby's gums. Attempt breastfeeding initiation again. Signs of Successful Breastfeeding Signs from your baby:  A gradual decrease in the number of sucks or complete cessation of sucking.  Falling asleep.  Relaxation of his or her body.  Retention of a small amount of milk in his or her mouth.  Letting go of your breast by himself or herself. Signs from you:  Breasts that have increased in firmness, weight, and size 1-3 hours after feeding.  Breasts that are softer immediately after  breastfeeding.  Increased milk volume, as well as a change in milk consistency and color by the fifth day of breastfeeding.  Nipples that are not sore, cracked, or bleeding. Signs That Your Baby is Getting Enough Milk  Wetting at least 3 diapers in a 24-hour period.   The urine should be clear and pale yellow by age 5 days.  At least 3 stools in a 24-hour period by age 5 days. The stool should be soft and yellow.  At least 3 stools in a 24-hour period by age 7 days. The stool should be seedy and yellow.  No loss of weight greater than 10% of birth weight during the first 3 days of age.  Average weight gain of 4-7 ounces (113-198 g) per week after age 4 days.  Consistent daily weight gain by age 5 days, without weight loss after the age of 2 weeks. After a feeding, your baby may spit up a small amount. This is common. BREASTFEEDING FREQUENCY AND DURATION Frequent feeding will help you make more milk and can prevent sore nipples and breast engorgement. Breastfeed when you feel the need to reduce the fullness of your breasts or when your baby shows signs of hunger. This is called "breastfeeding on demand." Avoid introducing a pacifier to your baby while you are working to establish breastfeeding (the first 4-6 weeks after your baby is born). After this time you may choose to use a pacifier. Research has shown that pacifier use during the first year of a baby's life decreases the risk of sudden infant death syndrome (SIDS). Allow your baby to feed on each breast as long as he or she wants. Breastfeed until your baby is finished feeding. When your baby unlatches or falls asleep while feeding from the first breast, offer the second breast. Because newborns are often sleepy in the first few weeks of life, you may need to awaken your baby to get him or her to feed. Breastfeeding times will vary from baby to baby. However, the following rules can serve as a guide to help you ensure that your baby is  properly fed:  Newborns (babies 4 weeks of age or younger) may breastfeed every 1-3 hours.  Newborns should not go longer than 3 hours during the day or 5 hours during the night without breastfeeding.  You should breastfeed your baby a minimum of 8 times in a 24-hour period until you begin to introduce solid foods to your baby at around 6 months of age. BREAST MILK PUMPING Pumping and storing breast milk allows you to ensure that your baby is exclusively fed your breast milk, even at times when you are unable to breastfeed. This is especially important if you are going back to work while you are still breastfeeding or when you are not able to be present during feedings. Your lactation consultant can give you guidelines on how long it is safe to store breast milk. A breast pump is a machine that allows you to pump milk from your breast into a sterile bottle. The pumped breast milk can then be stored in a refrigerator or freezer. Some breast pumps are operated by hand, while others use electricity. Ask your lactation consultant which type will work best for you. Breast pumps can be purchased, but some hospitals and breastfeeding support groups lease breast pumps on a monthly basis. A lactation consultant can teach you how to hand express breast milk, if you prefer not to use a pump. CARING FOR YOUR BREASTS WHILE YOU BREASTFEED Nipples can become dry, cracked, and sore while breastfeeding. The following recommendations can help keep your breasts moisturized and healthy:  Avoid using soap on your nipples.  Wear a supportive bra. Although not required, special nursing bras and tank tops are designed to allow access to your   breasts for breastfeeding without taking off your entire bra or top. Avoid wearing underwire-style bras or extremely tight bras.  Air dry your nipples for 3-4minutes after each feeding.  Use only cotton bra pads to absorb leaked breast milk. Leaking of breast milk between feedings  is normal.  Use lanolin on your nipples after breastfeeding. Lanolin helps to maintain your skin's normal moisture barrier. If you use pure lanolin, you do not need to wash it off before feeding your baby again. Pure lanolin is not toxic to your baby. You may also hand express a few drops of breast milk and gently massage that milk into your nipples and allow the milk to air dry. In the first few weeks after giving birth, some women experience extremely full breasts (engorgement). Engorgement can make your breasts feel heavy, warm, and tender to the touch. Engorgement peaks within 3-5 days after you give birth. The following recommendations can help ease engorgement:  Completely empty your breasts while breastfeeding or pumping. You may want to start by applying warm, moist heat (in the shower or with warm water-soaked hand towels) just before feeding or pumping. This increases circulation and helps the milk flow. If your baby does not completely empty your breasts while breastfeeding, pump any extra milk after he or she is finished.  Wear a snug bra (nursing or regular) or tank top for 1-2 days to signal your body to slightly decrease milk production.  Apply ice packs to your breasts, unless this is too uncomfortable for you.  Make sure that your baby is latched on and positioned properly while breastfeeding. If engorgement persists after 48 hours of following these recommendations, contact your health care provider or a lactation consultant. OVERALL HEALTH CARE RECOMMENDATIONS WHILE BREASTFEEDING  Eat healthy foods. Alternate between meals and snacks, eating 3 of each per day. Because what you eat affects your breast milk, some of the foods may make your baby more irritable than usual. Avoid eating these foods if you are sure that they are negatively affecting your baby.  Drink milk, fruit juice, and water to satisfy your thirst (about 10 glasses a day).  Rest often, relax, and continue to take  your prenatal vitamins to prevent fatigue, stress, and anemia.  Continue breast self-awareness checks.  Avoid chewing and smoking tobacco. Chemicals from cigarettes that pass into breast milk and exposure to secondhand smoke may harm your baby.  Avoid alcohol and drug use, including marijuana. Some medicines that may be harmful to your baby can pass through breast milk. It is important to ask your health care provider before taking any medicine, including all over-the-counter and prescription medicine as well as vitamin and herbal supplements. It is possible to become pregnant while breastfeeding. If birth control is desired, ask your health care provider about options that will be safe for your baby. SEEK MEDICAL CARE IF:  You feel like you want to stop breastfeeding or have become frustrated with breastfeeding.  You have painful breasts or nipples.  Your nipples are cracked or bleeding.  Your breasts are red, tender, or warm.  You have a swollen area on either breast.  You have a fever or chills.  You have nausea or vomiting.  You have drainage other than breast milk from your nipples.  Your breasts do not become full before feedings by the fifth day after you give birth.  You feel sad and depressed.  Your baby is too sleepy to eat well.  Your baby is having trouble sleeping.     Your baby is wetting less than 3 diapers in a 24-hour period.  Your baby has less than 3 stools in a 24-hour period.  Your baby's skin or the white part of his or her eyes becomes yellow.   Your baby is not gaining weight by 5 days of age. SEEK IMMEDIATE MEDICAL CARE IF:  Your baby is overly tired (lethargic) and does not want to wake up and feed.  Your baby develops an unexplained fever.   This information is not intended to replace advice given to you by your health care provider. Make sure you discuss any questions you have with your health care provider.   Document Released: 05/29/2005  Document Revised: 02/17/2015 Document Reviewed: 11/20/2012 Elsevier Interactive Patient Education 2016 [employer]  

## 2015-07-06 ENCOUNTER — Encounter: Payer: BC Managed Care – PPO | Admitting: Obstetrics & Gynecology

## 2015-07-14 ENCOUNTER — Telehealth: Payer: Self-pay | Admitting: *Deleted

## 2015-07-14 DIAGNOSIS — M5431 Sciatica, right side: Secondary | ICD-10-CM

## 2015-07-14 MED ORDER — CYCLOBENZAPRINE HCL 10 MG PO TABS: 10.0000 mg | ORAL_TABLET | Freq: Three times a day (TID) | ORAL | Status: DC | PRN

## 2015-07-14 NOTE — Telephone Encounter (Signed)
Received VM from patient requesting someone to call her back, she is 33 wks and is having some pain.  Called pt back, no answer, left VM.

## 2015-07-14 NOTE — Telephone Encounter (Signed)
Pt has been experiencing right lower back pain that radiates down her buttocks for the past few days.  Has been doing intermittent heat and Tylenol with some relief.  Informed pt to continue the heat and to also get an abdominal band to help support the abdomen.  Sent Flexeril to the pharmacy per Illene Bolus, CNM order.  Advised pt on use of medication.  Pt will call back with any further concerns.

## 2015-07-23 ENCOUNTER — Other Ambulatory Visit (INDEPENDENT_AMBULATORY_CARE_PROVIDER_SITE_OTHER): Payer: BC Managed Care – PPO

## 2015-07-23 ENCOUNTER — Ambulatory Visit: Payer: BC Managed Care – PPO | Admitting: *Deleted

## 2015-07-23 VITALS — BP 134/77 | HR 81

## 2015-07-23 DIAGNOSIS — O133 Gestational [pregnancy-induced] hypertension without significant proteinuria, third trimester: Secondary | ICD-10-CM | POA: Diagnosis not present

## 2015-07-23 DIAGNOSIS — Z3A34 34 weeks gestation of pregnancy: Secondary | ICD-10-CM | POA: Diagnosis not present

## 2015-07-23 LAB — CBC
HCT: 35 % — ABNORMAL LOW (ref 36.0–46.0)
Hemoglobin: 11.6 g/dL — ABNORMAL LOW (ref 12.0–15.0)
MCH: 30.1 pg (ref 26.0–34.0)
MCHC: 33.1 g/dL (ref 30.0–36.0)
MCV: 90.7 fL (ref 78.0–100.0)
MPV: 11.3 fL (ref 8.6–12.4)
PLATELETS: 250 10*3/uL (ref 150–400)
RBC: 3.86 MIL/uL — ABNORMAL LOW (ref 3.87–5.11)
RDW: 13.2 % (ref 11.5–15.5)
WBC: 9.8 10*3/uL (ref 4.0–10.5)

## 2015-07-23 NOTE — Progress Notes (Signed)
Pt here for BP check, BP 134 77, states she is having occasional blurred vision.  CBC, CMET, and urine PR:CR ratio sent to lab.  NST done-reactive.  Pt to follow-up next Tuesday.  Pre-eclampsia precautions reviewed.

## 2015-07-24 LAB — COMPREHENSIVE METABOLIC PANEL
ALT: 13 U/L (ref 6–29)
AST: 20 U/L (ref 10–30)
Albumin: 2.9 g/dL — ABNORMAL LOW (ref 3.6–5.1)
Alkaline Phosphatase: 133 U/L — ABNORMAL HIGH (ref 33–115)
BUN: 6 mg/dL — ABNORMAL LOW (ref 7–25)
CHLORIDE: 103 mmol/L (ref 98–110)
CO2: 23 mmol/L (ref 20–31)
CREATININE: 0.67 mg/dL (ref 0.50–1.10)
Calcium: 9.1 mg/dL (ref 8.6–10.2)
Glucose, Bld: 58 mg/dL — ABNORMAL LOW (ref 65–99)
Potassium: 4.1 mmol/L (ref 3.5–5.3)
SODIUM: 136 mmol/L (ref 135–146)
Total Bilirubin: 0.4 mg/dL (ref 0.2–1.2)
Total Protein: 5.7 g/dL — ABNORMAL LOW (ref 6.1–8.1)

## 2015-07-24 LAB — PROTEIN / CREATININE RATIO, URINE
Creatinine, Urine: 48 mg/dL (ref 20–320)
Protein Creatinine Ratio: 146 mg/g creat (ref 21–161)
TOTAL PROTEIN, URINE: 7 mg/dL (ref 5–24)

## 2015-07-27 ENCOUNTER — Ambulatory Visit (INDEPENDENT_AMBULATORY_CARE_PROVIDER_SITE_OTHER): Payer: BC Managed Care – PPO | Admitting: Physician Assistant

## 2015-07-27 ENCOUNTER — Encounter: Payer: Self-pay | Admitting: Physician Assistant

## 2015-07-27 VITALS — BP 132/87 | HR 111 | Wt 206.0 lb

## 2015-07-27 DIAGNOSIS — Z3493 Encounter for supervision of normal pregnancy, unspecified, third trimester: Secondary | ICD-10-CM

## 2015-07-27 DIAGNOSIS — Z3403 Encounter for supervision of normal first pregnancy, third trimester: Secondary | ICD-10-CM

## 2015-07-27 NOTE — Patient Instructions (Signed)
Pain Relief During Labor and Delivery Everyone experiences pain differently, but labor causes severe pain for many women. The amount of pain you experience during labor and delivery depends on your pain tolerance, contraction strength, and your baby's size and position. There are many ways to prepare for and deal with the pain, including:   Taking prenatal classes to learn about labor and delivery. The more informed you are, the less anxious and afraid you may be. This can help lessen the pain.  Taking pain-relieving medicine during labor and delivery.  Learning breathing and relaxation techniques.  Taking a shower or bath.  Getting massaged.  Changing positions.  Placing an ice pack on your back. Discuss your pain control options with your health care provider during your prenatal visits.  WHAT ARE THE TWO TYPES OF PAIN-RELIEVING MEDICINES? 1. Analgesics. These are medicines that decrease pain without total loss of feeling or muscle movement. 2. Anesthetics. These are medicines that block all feeling, including pain. There can be minor side effects of both types, such as nausea, trouble concentrating, becoming sleepy, and lowering the heart rate of the baby. However, health care providers are careful to give doses that will not seriously affect the baby.  WHAT ARE THE SPECIFIC TYPES OF ANALGESICS AND ANESTHETICS? Systemic Analgesic Systemic pain medicines affect your whole body rather than focusing pain relief on the area of your body experiencing pain. This type of medicine is given either through an IV tube in your vein or by a shot (injection) into your muscle. This medicine will lessen your pain but will not stop it completely. It may also make you sleepy, but it will not make you lose consciousness.  Local Anesthetic Local anesthetic isused tonumb a small area of your body. The medicine is injected into the area of nerves that carry feeling to the vagina, vulva, or the area between  the vagina and anus (perineum).  General Anesthetic This type of medicine causes you to lose consciousness so you do not feel pain. It is usually used only in emergency situations during labor. It is given through an IV tube or face mask. Paracervical Block A paracervical block is a form of local anesthesia given during labor. Numbing medicine is injected into the right and left sides of the cervix and vagina. It helps to lessen the pain caused by contractions and stretching of the cervix. It may have to be given more than once.  Pudendal Block A pudendal block is another form of local anesthesia. It is used to relieve the pain associated with pushing or stretching of the perineum at the time of delivery. An injection is given deep through the vaginal wall into the pudendal nerve in the pelvis, numbing the perineum.  Epidural Anesthetic An epidural is an injection of numbing medicine given in the lower back and into the epidural space near your spinal cord. The epidural numbs the lower half of your body. You may be able to move your legs but will not be allowed to walk. Epidurals can be used for labor, delivery, or cesarean deliveries.  To prevent the medicine from wearing off, a small tube (catheter) may be threaded into the epidural space and taped in place to prevent it from slipping out. Medicine can then be given continuously in small doses through the tube until you deliver. Spinal Block A spinal block is similar to an epidural, but the medicine is injected into the spinal fluid, not the epidural space. A spinal block is only given   once. It starts to relieve pain quickly but lasts only 1-2 hours. Spinal blocks can also be used for cesarean deliveries.  Combined Spinal-Epidural Block Combined spinal-epidural blocks combine the benefits of both the spinal and epidural blocks. The spinal part acts quickly to relieve pain and the epidural provides continuous pain relief. Hydrotherapy Immersion in  warm water during labor may provide comfort and relaxation. It may also help to lessen pain, the use of anesthesia, and the length of labor. However, immersion in water during the delivery (water birth) may have some risk involved and studies to determine safety and risks are ongoing. If you are a healthy woman who is expecting an uncomplicated birth, talk with your health care provider to see if water birth is an option for you.    This information is not intended to replace advice given to you by your health care provider. Make sure you discuss any questions you have with your health care provider.   Document Released: 09/14/2008 Document Revised: 06/03/2013 Document Reviewed: 10/17/2012 Elsevier Interactive Patient Education 2016 [employer]  

## 2015-07-27 NOTE — Progress Notes (Signed)
Subjective:  Kimberly Randolph is a 25 y.o. G1P0 at [redacted]w[redacted]d being seen today for ongoing prenatal care.  Patient reports intermittent blurry vision.  Contractions: Not present.  Vag. Bleeding: None. Movement: Present. Denies leaking of fluid, headache, epigastric pain.  She does have bilateral lower edema swelling (trace).   The following portions of the patient's history were reviewed and updated as appropriate: allergies, current medications, past family history, past medical history, past social history, past surgical history and problem list.   Objective:   Filed Vitals:   07/27/15 1547  BP: 132/87  Pulse: 111  Weight: 206 lb (93.441 kg)    Fetal Status: Fetal Heart Rate (bpm): 145   Movement: Present     General:  Alert, oriented and cooperative. Patient is in no acute distress.  Skin: Skin is warm and dry. No rash noted.   Cardiovascular: Normal heart rate noted  Respiratory: Normal respiratory effort, no problems with respiration noted  Abdomen: Soft, gravid, appropriate for gestational age. Pain/Pressure: Present     Pelvic: Vag. Bleeding: None Vag D/C Character: Thin   Cervical exam deferred        Extremities: Normal range of motion.  Edema: Trace  Mental Status: Normal mood and affect. Normal behavior. Normal judgment and thought content.   Urinalysis: Urine Protein: Negative Urine Glucose: Negative  Assessment and Plan:  Pregnancy: G1P0 at [redacted]w[redacted]d  There are no diagnoses linked to this encounter. Preterm labor symptoms and general obstetric precautions including but not limited to vaginal bleeding, contractions, leaking of fluid and fetal movement were reviewed in detail with the patient. Please refer to After Visit Summary for other counseling recommendations.  F/u 1 week  Bertram Denver, PA-C

## 2015-07-29 ENCOUNTER — Encounter (INDEPENDENT_AMBULATORY_CARE_PROVIDER_SITE_OTHER): Payer: Self-pay | Admitting: *Deleted

## 2015-07-29 DIAGNOSIS — Z3403 Encounter for supervision of normal first pregnancy, third trimester: Secondary | ICD-10-CM

## 2015-08-02 ENCOUNTER — Encounter: Payer: Self-pay | Admitting: *Deleted

## 2015-08-03 ENCOUNTER — Ambulatory Visit (INDEPENDENT_AMBULATORY_CARE_PROVIDER_SITE_OTHER): Payer: BC Managed Care – PPO | Admitting: Obstetrics and Gynecology

## 2015-08-03 ENCOUNTER — Encounter: Payer: Self-pay | Admitting: Obstetrics and Gynecology

## 2015-08-03 VITALS — BP 118/86 | HR 112 | Wt 208.0 lb

## 2015-08-03 DIAGNOSIS — Z3493 Encounter for supervision of normal pregnancy, unspecified, third trimester: Secondary | ICD-10-CM

## 2015-08-03 DIAGNOSIS — Z36 Encounter for antenatal screening of mother: Secondary | ICD-10-CM | POA: Diagnosis not present

## 2015-08-03 DIAGNOSIS — Z113 Encounter for screening for infections with a predominantly sexual mode of transmission: Secondary | ICD-10-CM

## 2015-08-03 DIAGNOSIS — Z3403 Encounter for supervision of normal first pregnancy, third trimester: Secondary | ICD-10-CM

## 2015-08-03 LAB — OB RESULTS CONSOLE GBS: GBS: NEGATIVE

## 2015-08-03 NOTE — Progress Notes (Signed)
Subjective:  Kimberly Randolph is a 25 y.o. G1P0 at [redacted]w[redacted]d being seen today for ongoing prenatal care.  She is currently monitored for the following issues for this low-risk pregnancy and has Supervision of normal pregnancy on her problem list.  Patient reports no complaints.  Contractions: Not present. Vag. Bleeding: None.  Movement: Present. Denies leaking of fluid.   The following portions of the patient's history were reviewed and updated as appropriate: allergies, current medications, past family history, past medical history, past social history, past surgical history and problem list. Problem list updated.  Objective:   Filed Vitals:   08/03/15 0823  BP: 118/86  Pulse: 112  Weight: 208 lb (94.348 kg)    Fetal Status: Fetal Heart Rate (bpm): 146 Fundal Height: 36 cm Movement: Present  Presentation: Vertex  General:  Alert, oriented and cooperative. Patient is in no acute distress.  Skin: Skin is warm and dry. No rash noted.   Cardiovascular: Normal heart rate noted  Respiratory: Normal respiratory effort, no problems with respiration noted  Abdomen: Soft, gravid, appropriate for gestational age. Pain/Pressure: Present     Pelvic: Vag. Bleeding: None Vag D/C Character: Thin   Cervical exam performed Dilation: 1 Effacement (%): Thick Station: Ballotable  Extremities: Normal range of motion.  Edema: Mild pitting, slight indentation  Mental Status: Normal mood and affect. Normal behavior. Normal judgment and thought content.   Urinalysis: Urine Protein: Trace Urine Glucose: Negative  Assessment and Plan:  Pregnancy: G1P0 at [redacted]w[redacted]d  1. Supervision of normal pregnancy, third trimester Patient is doing well  Cultures collected today - GC/Chlamydia probe amp ([place])not at [facility] - Culture, beta strep (group b only)  Preterm labor symptoms and general obstetric precautions including but not limited to vaginal bleeding, contractions, leaking of fluid and fetal movement were reviewed in  detail with the patient. Please refer to After Visit Summary for other counseling recommendations.  No Follow-up on file.   Catalina Antigua, MD

## 2015-08-04 ENCOUNTER — Telehealth: Payer: Self-pay | Admitting: *Deleted

## 2015-08-04 LAB — GC/CHLAMYDIA PROBE AMP (~~LOC~~) NOT AT ARMC
CHLAMYDIA, DNA PROBE: NEGATIVE
NEISSERIA GONORRHEA: NEGATIVE

## 2015-08-04 NOTE — Telephone Encounter (Signed)
Received notification from baby scripts that BP 127/97 and recheck 142/92. Pt declines headache, visual changes, epigastric pain, or excessive swelling in extremities.  Labs WDL.  Spoke with Dr Macon Large about pt case, recommended to follow-up next week.  Sent email to baby scripts to change parameters of notification for BP > 150/100.

## 2015-08-05 LAB — CULTURE, BETA STREP (GROUP B ONLY)

## 2015-08-09 ENCOUNTER — Encounter (HOSPITAL_COMMUNITY): Payer: Self-pay | Admitting: *Deleted

## 2015-08-09 ENCOUNTER — Inpatient Hospital Stay (HOSPITAL_COMMUNITY)
Admission: AD | Admit: 2015-08-09 | Discharge: 2015-08-09 | Disposition: A | Discharge status: Home / Self Care | Payer: BC Managed Care – PPO | Source: Ambulatory Visit | Attending: Family Medicine | Admitting: Family Medicine

## 2015-08-09 ENCOUNTER — Ambulatory Visit (INDEPENDENT_AMBULATORY_CARE_PROVIDER_SITE_OTHER): Payer: BC Managed Care – PPO | Admitting: Obstetrics and Gynecology

## 2015-08-09 ENCOUNTER — Encounter: Payer: Self-pay | Admitting: Obstetrics and Gynecology

## 2015-08-09 VITALS — BP 135/82 | HR 118 | Wt 212.0 lb

## 2015-08-09 DIAGNOSIS — Z3493 Encounter for supervision of normal pregnancy, unspecified, third trimester: Secondary | ICD-10-CM

## 2015-08-09 DIAGNOSIS — O133 Gestational [pregnancy-induced] hypertension without significant proteinuria, third trimester: Secondary | ICD-10-CM

## 2015-08-09 DIAGNOSIS — R030 Elevated blood-pressure reading, without diagnosis of hypertension: Secondary | ICD-10-CM | POA: Diagnosis present

## 2015-08-09 DIAGNOSIS — Z880 Allergy status to penicillin: Secondary | ICD-10-CM | POA: Insufficient documentation

## 2015-08-09 DIAGNOSIS — Z3A37 37 weeks gestation of pregnancy: Secondary | ICD-10-CM | POA: Diagnosis not present

## 2015-08-09 DIAGNOSIS — Z3403 Encounter for supervision of normal first pregnancy, third trimester: Secondary | ICD-10-CM

## 2015-08-09 LAB — URINE MICROSCOPIC-ADD ON

## 2015-08-09 LAB — URINALYSIS, ROUTINE W REFLEX MICROSCOPIC
Bilirubin Urine: NEGATIVE
Glucose, UA: NEGATIVE mg/dL
Hgb urine dipstick: NEGATIVE
Ketones, ur: NEGATIVE mg/dL
Nitrite: NEGATIVE
Protein, ur: NEGATIVE mg/dL
Specific Gravity, Urine: 1.01 (ref 1.005–1.030)
pH: 6.5 (ref 5.0–8.0)

## 2015-08-09 LAB — CBC
HEMATOCRIT: 33.7 % — AB (ref 36.0–46.0)
Hemoglobin: 11.7 g/dL — ABNORMAL LOW (ref 12.0–15.0)
MCH: 30.4 pg (ref 26.0–34.0)
MCHC: 34.7 g/dL (ref 30.0–36.0)
MCV: 87.5 fL (ref 78.0–100.0)
Platelets: 200 10*3/uL (ref 150–400)
RBC: 3.85 MIL/uL — AB (ref 3.87–5.11)
RDW: 13 % (ref 11.5–15.5)
WBC: 12.9 10*3/uL — AB (ref 4.0–10.5)

## 2015-08-09 LAB — COMPREHENSIVE METABOLIC PANEL
ALK PHOS: 150 U/L — AB (ref 38–126)
ALT: 14 U/L (ref 14–54)
AST: 27 U/L (ref 15–41)
Albumin: 2.8 g/dL — ABNORMAL LOW (ref 3.5–5.0)
Anion gap: 9 (ref 5–15)
BILIRUBIN TOTAL: 0.6 mg/dL (ref 0.3–1.2)
BUN: 6 mg/dL (ref 6–20)
CALCIUM: 8.3 mg/dL — AB (ref 8.9–10.3)
CO2: 21 mmol/L — ABNORMAL LOW (ref 22–32)
CREATININE: 0.62 mg/dL (ref 0.44–1.00)
Chloride: 108 mmol/L (ref 101–111)
Glucose, Bld: 79 mg/dL (ref 65–99)
Potassium: 4.1 mmol/L (ref 3.5–5.1)
Sodium: 138 mmol/L (ref 135–145)
TOTAL PROTEIN: 5.7 g/dL — AB (ref 6.5–8.1)

## 2015-08-09 LAB — PROTEIN / CREATININE RATIO, URINE
CREATININE, URINE: 71 mg/dL
Protein Creatinine Ratio: 0.08 mg/mg{Cre} (ref 0.00–0.15)
TOTAL PROTEIN, URINE: 6 mg/dL

## 2015-08-09 NOTE — Progress Notes (Signed)
Subjective:  Kimberly Randolph is a 25 y.o. G1P0 at [redacted]w[redacted]d being seen today for ongoing prenatal care.  She is currently monitored for the following issues for this low-risk pregnancy and has Supervision of normal pregnancy on her problem list.  Patient reports not feeling well, reporting sleeping all day. She has a mild headache which resolves with sleep. She reports some epigastric discomfort and seeing light flashes.  Contractions: Not present. Vag. Bleeding: None.  Movement: Present. Denies leaking of fluid.  Recorded BP on Babyscript 2/22- 121/97  2/26- 132/94  2/27- 126/98   The following portions of the patient's history were reviewed and updated as appropriate: allergies, current medications, past family history, past medical history, past social history, past surgical history and problem list. Problem list updated.  Objective:   Filed Vitals:   08/09/15 1559  BP: 135/82  Randolph: 118  Weight: 212 lb (96.163 kg)    Fetal Status: Fetal Heart Rate (bpm): 139   Movement: Present     General:  Alert, oriented and cooperative. Patient is in no acute distress.  Skin: Skin is warm and dry. No rash noted.   Cardiovascular: Normal heart rate noted  Respiratory: Normal respiratory effort, no problems with respiration noted  Abdomen: Soft, gravid, appropriate for gestational age. Pain/Pressure: Absent     Pelvic: Vag. Bleeding: None     Cervical exam deferred        Extremities: Normal range of motion.     Mental Status: Normal mood and affect. Normal behavior. Normal judgment and thought content.   Urinalysis: Urine Protein: Trace Urine Glucose: Negative  Assessment and Plan:  Pregnancy: G1P0 at [redacted]w[redacted]d  1. Supervision of normal pregnancy, third trimester Patient with elevated BP recorded by Babyscript Will send to MAU for further evaluation and possible IOL  Term labor symptoms and general obstetric precautions including but not limited to vaginal bleeding, contractions, leaking of fluid  and fetal movement were reviewed in detail with the patient. Please refer to After Visit Summary for other counseling recommendations.  Return in about 1 week (around 08/16/2015).   Catalina Antigua, MD

## 2015-08-09 NOTE — MAU Note (Signed)
Bp up today in office, has been kind of gradually creeping.  Slight headache today, at times sees spots.  No change in swelling, denies epigastric pain

## 2015-08-09 NOTE — Progress Notes (Signed)
Has some epigastric pressure, but no pain.

## 2015-08-09 NOTE — Discharge Instructions (Signed)

## 2015-08-09 NOTE — MAU Provider Note (Signed)
Chief Complaint:  Hypertension and Headache   First Provider Initiated Contact with Patient 08/09/15 1758     HPI: Kimberly Randolph is a 25 y.o. G1P0 at [redacted]w[redacted]d who presents to maternity admissions reporting Elevated BP on home cuff x 3 (120-130/90's), mild HA's that resolve w/ rest x 2 weeks on days when she works and few episodes seeing flashes upon waking and getting out of bed in the morning over the past two weeks.  No other Hx elevated BP's.   Location: Generalized Quality: pressure Severity: Mild Duration: 2 weeks Context: at end of days at work Timing: Intermittent Modifying factors: Resolves w/ rest  Associated signs and symptoms: Pos for seeing flashes in vision x 2 weeks.   Denies contractions, leakage of fluid or vaginal bleeding. Good fetal movement.   Past Medical History: Past Medical History  Diagnosis Date  . Medical history non-contributory     Past obstetric history: OB History  Gravida Para Term Preterm AB SAB TAB Ectopic Multiple Living  1             # Outcome Date GA Lbr Len/2nd Weight Sex Delivery Anes PTL Lv  1 Current               Past Surgical History: Past Surgical History  Procedure Laterality Date  . Wisdom tooth extraction       Family History: Family History  Problem Relation Age of Onset  . Cancer Mother 7    Colon Cancer  . Cancer Father 40    Bladder  . Diabetes Father     Type 2  . Hypertension Father   . Birth defects Sister infancy    Heart valve defect  . Diabetes Paternal Grandmother   . Cancer Paternal Grandfather     Pancreatic    Social History: Social History  Substance Use Topics  . Smoking status: Never Smoker   . Smokeless tobacco: None  . Alcohol Use: No    Allergies:  Allergies  Allergen Reactions  . Penicillins Rash    Has patient had a PCN reaction causing immediate rash, facial/tongue/throat swelling, SOB or lightheadedness with hypotension: unknown Has patient had a PCN reaction causing severe rash  involving mucus membranes or skin necrosis: unknown Has patient had a PCN reaction that required hospitalization unknown Has patient had a PCN reaction occurring within the last 10 years: no If all of the above answers are "NO", then may proceed with Cephalosporin use.     Meds:  Prescriptions prior to admission  Medication Sig Dispense Refill Last Dose  . cetirizine (ZYRTEC) 10 MG tablet Take 10 mg by mouth at bedtime. Reported on 08/09/2015   08/08/2015 at Unknown time  . Prenatal Vit-Fe Fumarate-FA (PRENATAL ONE DAILY PO) Take 1 tablet by mouth daily.    08/08/2015 at Unknown time  . cyclobenzaprine (FLEXERIL) 10 MG tablet Take 1 tablet (10 mg total) by mouth every 8 (eight) hours as needed for muscle spasms. (Patient not taking: Reported on 08/03/2015) 20 tablet 0 Not Taking    I have reviewed patient's Past Medical Hx, Surgical Hx, Family Hx, Social Hx, medications and allergies.   ROS:  Review of Systems  Constitutional: Negative for fever and chills.  Eyes: Positive for visual disturbance. Negative for photophobia and pain.  Cardiovascular: Negative for leg swelling.  Gastrointestinal: Negative for abdominal pain.  Genitourinary: Negative for vaginal bleeding.  Neurological: Positive for headaches. Negative for seizures and weakness.    Physical Exam  Patient Vitals  for the past 24 hrs:  BP Temp Temp src Pulse Resp  08/09/15 1945 115/79 mmHg - - 71 -  08/09/15 1930 130/80 mmHg - - 92 -  08/09/15 1916 104/70 mmHg - - 78 -  08/09/15 1900 103/66 mmHg - - 61 -  08/09/15 1845 105/73 mmHg - - 68 -  08/09/15 1831 106/64 mmHg - - 79 -  08/09/15 1813 117/86 mmHg - - 79 -  08/09/15 1748 124/88 mmHg 98.2 F (36.8 C) Oral 84 18   Constitutional: Well-developed, well-nourished female in no acute distress.  Cardiovascular: normal rate Respiratory: normal effort GI: Abd soft, non-tender, gravid appropriate for gestational age.  MS: Extremities nontender, no edema, normal  ROM Neurologic: Alert and oriented x 4.  GU: Deferred    FHT:  Baseline 135 , moderate variability, accelerations present, no decelerations Contractions: None   Labs: Results for orders placed or performed during the hospital encounter of 08/09/15 (from the past 24 hour(s))  Protein / creatinine ratio, urine     Status: None   Collection Time: 08/09/15  5:50 PM  Result Value Ref Range   Creatinine, Urine 71.00 mg/dL   Total Protein, Urine 6 mg/dL   Protein Creatinine Ratio 0.08 0.00 - 0.15 mg/mg[Cre]  Urinalysis, Routine w reflex microscopic (not at [facility])     Status: Abnormal   Collection Time: 08/09/15  5:50 PM  Result Value Ref Range   Color, Urine YELLOW YELLOW   APPearance CLEAR CLEAR   Specific Gravity, Urine 1.010 1.005 - 1.030   pH 6.5 5.0 - 8.0   Glucose, UA NEGATIVE NEGATIVE mg/dL   Hgb urine dipstick NEGATIVE NEGATIVE   Bilirubin Urine NEGATIVE NEGATIVE   Ketones, ur NEGATIVE NEGATIVE mg/dL   Protein, ur NEGATIVE NEGATIVE mg/dL   Nitrite NEGATIVE NEGATIVE   Leukocytes, UA MODERATE (A) NEGATIVE  Urine microscopic-add on     Status: Abnormal   Collection Time: 08/09/15  5:50 PM  Result Value Ref Range   Squamous Epithelial / LPF 0-5 (A) NONE SEEN   WBC, UA 0-5 0 - 5 WBC/hpf   RBC / HPF 0-5 0 - 5 RBC/hpf   Bacteria, UA FEW (A) NONE SEEN  CBC     Status: Abnormal   Collection Time: 08/09/15  6:23 PM  Result Value Ref Range   WBC 12.9 (H) 4.0 - 10.5 K/uL   RBC 3.85 (L) 3.87 - 5.11 MIL/uL   Hemoglobin 11.7 (L) 12.0 - 15.0 g/dL   HCT 16.1 (L) 09.6 - 04.5 %   MCV 87.5 78.0 - 100.0 fL   MCH 30.4 26.0 - 34.0 pg   MCHC 34.7 30.0 - 36.0 g/dL   RDW 40.9 81.1 - 91.4 %   Platelets 200 150 - 400 K/uL  Comprehensive metabolic panel     Status: Abnormal   Collection Time: 08/09/15  6:23 PM  Result Value Ref Range   Sodium 138 135 - 145 mmol/L   Potassium 4.1 3.5 - 5.1 mmol/L   Chloride 108 101 - 111 mmol/L   CO2 21 (L) 22 - 32 mmol/L   Glucose, Bld 79 65 - 99 mg/dL    BUN 6 6 - 20 mg/dL   Creatinine, Ser 7.82 0.44 - 1.00 mg/dL   Calcium 8.3 (L) 8.9 - 10.3 mg/dL   Total Protein 5.7 (L) 6.5 - 8.1 g/dL   Albumin 2.8 (L) 3.5 - 5.0 g/dL   AST 27 15 - 41 U/L   ALT 14 14 - 54 U/L  Alkaline Phosphatase 150 (H) 38 - 126 U/L   Total Bilirubin 0.6 0.3 - 1.2 mg/dL   GFR calc non Af Amer >60 >60 mL/min   GFR calc Af Amer >60 >60 mL/min   Anion gap 9 5 - 15    Imaging:  No results found.  MAU Course: CBC, CMET, UA, P:C, NST, BP Q15 minutes.  Discuss history, blood pressures, labs and exam with Dr. Adrian Blackwater. Patient appropriate for discharge and close follow-up in the office.  MDM: 65 old female [redacted] weeks gestation with mildly elevated blood pressures per home cuff, but no elevated pressures in office or MAU visit. Normal preeclampsia labs. Headache and vision changes not consistent with preeclampsia due to mild, rare nature an alternate cause. Doubt preeclampsia diagnosis at this time.  Assessment: 1. Gestational hypertension w/o significant proteinuria in 3rd trimester     Plan: Discharge home in stable condition per consult with Dr. Adrian Blackwater.  Labor precautions and fetal kick counts Preeclampsia precautions. Will take patient out of work 1 week. Discussed proper blood pressure-checking technique. Follow-up Information    Follow up with [facility] On 08/12/2015.   Specialty:  Obstetrics and Gynecology   Why:  Routine prenatal visit and blood pressure check   Contact information:   [address] [place] [address] [phone]      Follow up with [facility] [place] [facility].   Why:  As needed if symptoms worsen   Contact information:   [address] [id number] mc [place] [address] [phone]        Medication List    TAKE these medications        cetirizine 10 MG tablet  Commonly known as:  ZYRTEC  Take 10 mg by mouth at bedtime.  Reported on 08/09/2015     cyclobenzaprine 10 MG tablet  Commonly known as:  FLEXERIL  Take 1 tablet (10 mg total) by mouth every 8 (eight) hours as needed for muscle spasms.     PRENATAL ONE DAILY PO  Take 1 tablet by mouth daily.        [place], [place] 08/09/2015 7:59 PM

## 2015-08-10 ENCOUNTER — Other Ambulatory Visit: Payer: BC Managed Care – PPO | Admitting: *Deleted

## 2015-08-10 NOTE — Progress Notes (Signed)
Spoke to Dr Penne Lash in regards to pt BP readings, would like pt to come to office to check BP with Baby Script cuff and check the difference with the office BP cuff.  Baby Script cuff = 133/86 and office cuff = 135/81.  Pt weight today = 212 lb, declines headache, visual changes, or excessive swelling in extremities.  Reviewed pre eclampsia precautions and gave 24 hr urine specimen to collect per Dr Penne Lash order.  Pt will bring in on Thursday when she is seen for follow-up.

## 2015-08-12 ENCOUNTER — Ambulatory Visit (INDEPENDENT_AMBULATORY_CARE_PROVIDER_SITE_OTHER): Payer: BC Managed Care – PPO | Admitting: *Deleted

## 2015-08-12 VITALS — BP 134/89 | HR 108 | Wt 210.0 lb

## 2015-08-12 DIAGNOSIS — O133 Gestational [pregnancy-induced] hypertension without significant proteinuria, third trimester: Secondary | ICD-10-CM | POA: Diagnosis not present

## 2015-08-12 NOTE — Progress Notes (Signed)
Informed pt of preeclampsia precautions, informed her to continue to monitor BP at home and to go to MAU if BP is > 150/100 or she starts having symptoms. Pt acknowledged.

## 2015-08-13 LAB — COMPREHENSIVE METABOLIC PANEL
ALK PHOS: 159 U/L — AB (ref 33–115)
ALT: 11 U/L (ref 6–29)
AST: 20 U/L (ref 10–30)
Albumin: 2.8 g/dL — ABNORMAL LOW (ref 3.6–5.1)
BUN: 8 mg/dL (ref 7–25)
CALCIUM: 8.4 mg/dL — AB (ref 8.6–10.2)
CO2: 21 mmol/L (ref 20–31)
Chloride: 107 mmol/L (ref 98–110)
Creat: 0.63 mg/dL (ref 0.50–1.10)
GLUCOSE: 76 mg/dL (ref 65–99)
Potassium: 4.3 mmol/L (ref 3.5–5.3)
Sodium: 137 mmol/L (ref 135–146)
Total Bilirubin: 0.4 mg/dL (ref 0.2–1.2)
Total Protein: 5.1 g/dL — ABNORMAL LOW (ref 6.1–8.1)

## 2015-08-13 LAB — CBC
HCT: 32.3 % — ABNORMAL LOW (ref 36.0–46.0)
Hemoglobin: 11.2 g/dL — ABNORMAL LOW (ref 12.0–15.0)
MCH: 31.1 pg (ref 26.0–34.0)
MCHC: 34.7 g/dL (ref 30.0–36.0)
MCV: 89.7 fL (ref 78.0–100.0)
MPV: 11.2 fL (ref 8.6–12.4)
PLATELETS: 196 10*3/uL (ref 150–400)
RBC: 3.6 MIL/uL — ABNORMAL LOW (ref 3.87–5.11)
RDW: 13.6 % (ref 11.5–15.5)
WBC: 9.6 10*3/uL (ref 4.0–10.5)

## 2015-08-13 LAB — PROTEIN, URINE, 24 HOUR
PROTEIN 24H UR: 230 mg/(24.h) — AB (ref <150)
Protein, Urine: 10 mg/dL (ref 5–24)

## 2015-08-13 LAB — CREATININE CLEARANCE, URINE, 24 HOUR
CREAT CLEAR: 170 mL/min — AB (ref 75–115)
Creatinine, 24H Ur: 1.54 g/(24.h) (ref 0.63–2.50)
Creatinine, Urine: 67 mg/dL (ref 20–320)
Creatinine: 0.63 mg/dL (ref 0.50–1.10)

## 2015-08-17 ENCOUNTER — Ambulatory Visit (INDEPENDENT_AMBULATORY_CARE_PROVIDER_SITE_OTHER): Payer: BC Managed Care – PPO | Admitting: Obstetrics & Gynecology

## 2015-08-17 VITALS — BP 118/84 | HR 94 | Wt 210.0 lb

## 2015-08-17 DIAGNOSIS — Z3403 Encounter for supervision of normal first pregnancy, third trimester: Secondary | ICD-10-CM

## 2015-08-17 DIAGNOSIS — Z3493 Encounter for supervision of normal pregnancy, unspecified, third trimester: Secondary | ICD-10-CM

## 2015-08-17 NOTE — Progress Notes (Signed)
No longer in Babyscripts due to elevated blood pressures.

## 2015-08-17 NOTE — Progress Notes (Signed)
Subjective:  Kimberly JuniperKatie Randolph is a 25 y.o. G1P0 at 5043w1d being seen today for ongoing prenatal care.  She is currently monitored for the following issues for this low-risk pregnancy and has Supervision of normal pregnancy on her problem list.  Patient reports no complaints.  Contractions: Not present. Vag. Bleeding: None.  Movement: Present. Denies leaking of fluid.   The following portions of the patient's history were reviewed and updated as appropriate: allergies, current medications, past family history, past medical history, past social history, past surgical history and problem list. Problem list updated.  Objective:   Filed Vitals:   08/17/15 0948  BP: 118/84  Pulse: 94  Weight: 210 lb (95.255 kg)    Fetal Status: Fetal Heart Rate (bpm): 162 Fundal Height: 37 cm Movement: Present  Presentation: Vertex  General:  Alert, oriented and cooperative. Patient is in no acute distress.  Skin: Skin is warm and dry. No rash noted.   Cardiovascular: Normal heart rate noted  Respiratory: Normal respiratory effort, no problems with respiration noted  Abdomen: Soft, gravid, appropriate for gestational age. Pain/Pressure: Present     Pelvic: Vag. Bleeding: None Vag D/C Character: Thick   Cervical exam performed Dilation: Fingertip Effacement (%): 50 Station: Ballotable  Extremities: Normal range of motion.  Edema: Trace  Mental Status: Normal mood and affect. Normal behavior. Normal judgment and thought content.   Urinalysis: Urine Protein: Trace Urine Glucose: Negative  Assessment and Plan:  Pregnancy: G1P0 at 6643w1d  1. Supervision of normal pregnancy, third trimester Doing well  Reviewed PP contraception and labor  Term labor symptoms and general obstetric precautions including but not limited to vaginal bleeding, contractions, leaking of fluid and fetal movement were reviewed in detail with the patient. Please refer to After Visit Summary for other counseling recommendations.  Return in  about 1 week (around 08/24/2015).   Willodean Rosenthalarolyn Harraway-Smith, MD

## 2015-08-17 NOTE — Patient Instructions (Signed)
Levonorgestrel intrauterine device (IUD) What is this medicine? LEVONORGESTREL IUD (LEE voe nor jes trel) is a contraceptive (birth control) device. The device is placed inside the uterus by a healthcare professional. It is used to prevent pregnancy and can also be used to treat heavy bleeding that occurs during your period. Depending on the device, it can be used for 3 to 5 years. This medicine may be used for other purposes; ask your health care provider or pharmacist if you have questions. What should I tell my health care provider before I take this medicine? They need to know if you have any of these conditions: -abnormal Pap smear -cancer of the breast, uterus, or cervix -diabetes -endometritis -genital or pelvic infection now or in the past -have more than one sexual partner or your partner has more than one partner -heart disease -history of an ectopic or tubal pregnancy -immune system problems -IUD in place -liver disease or tumor -problems with blood clots or take blood-thinners -use intravenous drugs -uterus of unusual shape -vaginal bleeding that has not been explained -an unusual or allergic reaction to levonorgestrel, other hormones, silicone, or polyethylene, medicines, foods, dyes, or preservatives -pregnant or trying to get pregnant -breast-feeding How should I use this medicine? This device is placed inside the uterus by a health care professional. Talk to your pediatrician regarding the use of this medicine in children. Special care may be needed. Overdosage: If you think you have taken too much of this medicine contact a poison control center or emergency room at once. NOTE: This medicine is only for you. Do not share this medicine with others. What if I miss a dose? This does not apply. What may interact with this medicine? Do not take this medicine with any of the following medications: -amprenavir -bosentan -fosamprenavir This medicine may also interact with  the following medications: -aprepitant -barbiturate medicines for inducing sleep or treating seizures -bexarotene -griseofulvin -medicines to treat seizures like carbamazepine, ethotoin, felbamate, oxcarbazepine, phenytoin, topiramate -modafinil -pioglitazone -rifabutin -rifampin -rifapentine -some medicines to treat HIV infection like atazanavir, indinavir, lopinavir, nelfinavir, tipranavir, ritonavir -St. John's wort -warfarin This list may not describe all possible interactions. Give your health care provider a list of all the medicines, herbs, non-prescription drugs, or dietary supplements you use. Also tell them if you smoke, drink alcohol, or use illegal drugs. Some items may interact with your medicine. What should I watch for while using this medicine? Visit your doctor or health care professional for regular check ups. See your doctor if you or your partner has sexual contact with others, becomes HIV positive, or gets a sexual transmitted disease. This product does not protect you against HIV infection (AIDS) or other sexually transmitted diseases. You can check the placement of the IUD yourself by reaching up to the top of your vagina with clean fingers to feel the threads. Do not pull on the threads. It is a good habit to check placement after each menstrual period. Call your doctor right away if you feel more of the IUD than just the threads or if you cannot feel the threads at all. The IUD may come out by itself. You may become pregnant if the device comes out. If you notice that the IUD has come out use a backup birth control method like condoms and call your health care provider. Using tampons will not change the position of the IUD and are okay to use during your period. What side effects may I notice from receiving this medicine?   Side effects that you should report to your doctor or health care professional as soon as possible: -allergic reactions like skin rash, itching or  hives, swelling of the face, lips, or tongue -fever, flu-like symptoms -genital sores -high blood pressure -no menstrual period for 6 weeks during use -pain, swelling, warmth in the leg -pelvic pain or tenderness -severe or sudden headache -signs of pregnancy -stomach cramping -sudden shortness of breath -trouble with balance, talking, or walking -unusual vaginal bleeding, discharge -yellowing of the eyes or skin Side effects that usually do not require medical attention (report to your doctor or health care professional if they continue or are bothersome): -acne -breast pain -change in sex drive or performance -changes in weight -cramping, dizziness, or faintness while the device is being inserted -headache -irregular menstrual bleeding within first 3 to 6 months of use -nausea This list may not describe all possible side effects. Call your doctor for medical advice about side effects. You may report side effects to FDA at 1-800-FDA-1088. Where should I keep my medicine? This does not apply. NOTE: This sheet is a summary. It may not cover all possible information. If you have questions about this medicine, talk to your doctor, pharmacist, or health care provider.    2016, Elsevier/Gold Standard. (2011-06-29 13:54:04)  

## 2015-08-24 ENCOUNTER — Ambulatory Visit (INDEPENDENT_AMBULATORY_CARE_PROVIDER_SITE_OTHER): Payer: BC Managed Care – PPO | Admitting: Family Medicine

## 2015-08-24 VITALS — BP 122/85 | HR 86 | Wt 212.0 lb

## 2015-08-24 DIAGNOSIS — B3731 Acute candidiasis of vulva and vagina: Secondary | ICD-10-CM

## 2015-08-24 DIAGNOSIS — B373 Candidiasis of vulva and vagina: Secondary | ICD-10-CM

## 2015-08-24 DIAGNOSIS — Z3483 Encounter for supervision of other normal pregnancy, third trimester: Secondary | ICD-10-CM

## 2015-08-24 DIAGNOSIS — Z3493 Encounter for supervision of normal pregnancy, unspecified, third trimester: Secondary | ICD-10-CM

## 2015-08-24 MED ORDER — TERCONAZOLE 0.8 % VA CREA: 1.0000 | TOPICAL_CREAM | Freq: Every day | VAGINAL | Status: DC

## 2015-08-24 NOTE — Progress Notes (Signed)
Subjective:  Kimberly Randolph is a 25 y.o. G1P0 at 7845w1d being seen today for ongoing prenatal care.  She is currently monitored for the following issues for this low-risk pregnancy and has Supervision of normal pregnancy on her problem list.  Patient reports no complaints.  Contractions: Irregular. Vag. Bleeding: None.  Movement: Present. Denies leaking of fluid.   The following portions of the patient's history were reviewed and updated as appropriate: allergies, current medications, past family history, past medical history, past social history, past surgical history and problem list. Problem list updated.  Objective:   Filed Vitals:   08/24/15 0948  BP: 122/85  Pulse: 86  Weight: 212 lb (96.163 kg)    Fetal Status: Fetal Heart Rate (bpm): 128 Fundal Height: 35 cm Movement: Present  Presentation: Vertex  General:  Alert, oriented and cooperative. Patient is in no acute distress.  Skin: Skin is warm and dry. No rash noted.   Cardiovascular: Normal heart rate noted  Respiratory: Normal respiratory effort, no problems with respiration noted  Abdomen: Soft, gravid, appropriate for gestational age. Pain/Pressure: Present     Pelvic: Vag. Bleeding: None Vag D/C Character: Mucous   Cervical exam performed Dilation: 1.5 Effacement (%): 70 Station: -3  Extremities: Normal range of motion.  Edema: Trace  Mental Status: Normal mood and affect. Normal behavior. Normal judgment and thought content.   Urinalysis: Urine Protein: Trace Urine Glucose: Negative Membranes stripped Assessment and Plan:  Pregnancy: G1P0 at 3645w1d  1. Candida vaginitis  - terconazole (TERAZOL 3) 0.8 % vaginal cream; Place 1 applicator vaginally at bedtime.  Dispense: 20 g; Refill: 0  2. Supervision of normal pregnancy, third trimester Continue routine prenatal care.   Term labor symptoms and general obstetric precautions including but not limited to vaginal bleeding, contractions, leaking of fluid and fetal movement  were reviewed in detail with the patient. Please refer to After Visit Summary for other counseling recommendations.  Return for OB visit and NST.   Reva Boresanya S Jasyn Mey, MD

## 2015-08-24 NOTE — Patient Instructions (Signed)
Third Trimester of Pregnancy The third trimester is from week 29 through week 42, months 7 through 9. The third trimester is a time when the fetus is growing rapidly. At the end of the ninth month, the fetus is about 20 inches in length and weighs 6-10 pounds.  BODY CHANGES Your body goes through many changes during pregnancy. The changes vary from woman to woman.   Your weight will continue to increase. You can expect to gain 25-35 pounds (11-16 kg) by the end of the pregnancy.  You may begin to get stretch marks on your hips, abdomen, and breasts.  You may urinate more often because the fetus is moving lower into your pelvis and pressing on your bladder.  You may develop or continue to have heartburn as a result of your pregnancy.  You may develop constipation because certain hormones are causing the muscles that push waste through your intestines to slow down.  You may develop hemorrhoids or swollen, bulging veins (varicose veins).  You may have pelvic pain because of the weight gain and pregnancy hormones relaxing your joints between the bones in your pelvis. Backaches may result from overexertion of the muscles supporting your posture.  You may have changes in your hair. These can include thickening of your hair, rapid growth, and changes in texture. Some women also have hair loss during or after pregnancy, or hair that feels dry or thin. Your hair will most likely return to normal after your baby is born.  Your breasts will continue to grow and be tender. A yellow discharge may leak from your breasts called colostrum.  Your belly button may stick out.  You may feel short of breath because of your expanding uterus.  You may notice the fetus "dropping," or moving lower in your abdomen.  You may have a bloody mucus discharge. This usually occurs a few days to a week before labor begins.  Your cervix becomes thin and soft (effaced) near your due date. WHAT TO EXPECT AT YOUR  PRENATAL EXAMS  You will have prenatal exams every 2 weeks until week 36. Then, you will have weekly prenatal exams. During a routine prenatal visit:  You will be weighed to make sure you and the fetus are growing normally.  Your blood pressure is taken.  Your abdomen will be measured to track your baby's growth.  The fetal heartbeat will be listened to.  Any test results from the previous visit will be discussed.  You may have a cervical check near your due date to see if you have effaced. At around 36 weeks, your caregiver will check your cervix. At the same time, your caregiver will also perform a test on the secretions of the vaginal tissue. This test is to determine if a type of bacteria, Group B streptococcus, is present. Your caregiver will explain this further. Your caregiver may ask you:  What your birth plan is.  How you are feeling.  If you are feeling the baby move.  If you have had any abnormal symptoms, such as leaking fluid, bleeding, severe headaches, or abdominal cramping.  If you are using any tobacco products, including cigarettes, chewing tobacco, and electronic cigarettes.  If you have any questions. Other tests or screenings that may be performed during your third trimester include:  Blood tests that check for low iron levels (anemia).  Fetal testing to check the health, activity level, and growth of the fetus. Testing is done if you have certain medical conditions or if   there are problems during the pregnancy.  HIV (human immunodeficiency virus) testing. If you are at high risk, you may be screened for HIV during your third trimester of pregnancy. FALSE LABOR You may feel small, irregular contractions that eventually go away. These are called Braxton Hicks contractions, or false labor. Contractions may last for hours, days, or even weeks before true labor sets in. If contractions come at regular intervals, intensify, or become painful, it is best to be seen  by your caregiver.  SIGNS OF LABOR   Menstrual-like cramps.  Contractions that are 5 minutes apart or less.  Contractions that start on the top of the uterus and spread down to the lower abdomen and back.  A sense of increased pelvic pressure or back pain.  A watery or bloody mucus discharge that comes from the vagina. If you have any of these signs before the 37th week of pregnancy, call your caregiver right away. You need to go to the hospital to get checked immediately. HOME CARE INSTRUCTIONS   Avoid all smoking, herbs, alcohol, and unprescribed drugs. These chemicals affect the formation and growth of the baby.  Do not use any tobacco products, including cigarettes, chewing tobacco, and electronic cigarettes. If you need help quitting, ask your health care provider. You may receive counseling support and other resources to help you quit.  Follow your caregiver's instructions regarding medicine use. There are medicines that are either safe or unsafe to take during pregnancy.  Exercise only as directed by your caregiver. Experiencing uterine cramps is a good sign to stop exercising.  Continue to eat regular, healthy meals.  Wear a good support bra for breast tenderness.  Do not use hot tubs, steam rooms, or saunas.  Wear your seat belt at all times when driving.  Avoid raw meat, uncooked cheese, cat litter boxes, and soil used by cats. These carry germs that can cause birth defects in the baby.  Take your prenatal vitamins.  Take 1500-2000 mg of calcium daily starting at the 20th week of pregnancy until you deliver your baby.  Try taking a stool softener (if your caregiver approves) if you develop constipation. Eat more high-fiber foods, such as fresh vegetables or fruit and whole grains. Drink plenty of fluids to keep your urine clear or pale yellow.  Take warm sitz baths to soothe any pain or discomfort caused by hemorrhoids. Use hemorrhoid cream if your caregiver  approves.  If you develop varicose veins, wear support hose. Elevate your feet for 15 minutes, 3-4 times a day. Limit salt in your diet.  Avoid heavy lifting, wear low heal shoes, and practice good posture.  Rest a lot with your legs elevated if you have leg cramps or low back pain.  Visit your dentist if you have not gone during your pregnancy. Use a soft toothbrush to brush your teeth and be gentle when you floss.  A sexual relationship may be continued unless your caregiver directs you otherwise.  Do not travel far distances unless it is absolutely necessary and only with the approval of your caregiver.  Take prenatal classes to understand, practice, and ask questions about the labor and delivery.  Make a trial run to the hospital.  Pack your hospital bag.  Prepare the baby's nursery.  Continue to go to all your prenatal visits as directed by your caregiver. SEEK MEDICAL CARE IF:  You are unsure if you are in labor or if your water has broken.  You have dizziness.  You have   mild pelvic cramps, pelvic pressure, or nagging pain in your abdominal area.  You have persistent nausea, vomiting, or diarrhea.  You have a bad smelling vaginal discharge.  You have pain with urination. SEEK IMMEDIATE MEDICAL CARE IF:   You have a fever.  You are leaking fluid from your vagina.  You have spotting or bleeding from your vagina.  You have severe abdominal cramping or pain.  You have rapid weight loss or gain.  You have shortness of breath with chest pain.  You notice sudden or extreme swelling of your face, hands, ankles, feet, or legs.  You have not felt your baby move in over an hour.  You have severe headaches that do not go away with medicine.  You have vision changes.   This information is not intended to replace advice given to you by your health care provider. Make sure you discuss any questions you have with your health care provider.   Document Released:  05/23/2001 Document Revised: 06/19/2014 Document Reviewed: 07/30/2012 Elsevier Interactive Patient Education 2016 [employer]  Breastfeeding Deciding to breastfeed is one of the best choices you can make for you and your baby. A change in hormones during pregnancy causes your breast tissue to grow and increases the number and size of your milk ducts. These hormones also allow proteins, sugars, and fats from your blood supply to make breast milk in your milk-producing glands. Hormones prevent breast milk from being released before your baby is born as well as prompt milk flow after birth. Once breastfeeding has begun, thoughts of your baby, as well as his or her sucking or crying, can stimulate the release of milk from your milk-producing glands.  BENEFITS OF BREASTFEEDING For Your Baby  Your first milk (colostrum) helps your baby's digestive system function better.  There are antibodies in your milk that help your baby fight off infections.  Your baby has a lower incidence of asthma, allergies, and sudden infant death syndrome.  The nutrients in breast milk are better for your baby than infant formulas and are designed uniquely for your baby's needs.  Breast milk improves your baby's brain development.  Your baby is less likely to develop other conditions, such as childhood obesity, asthma, or type 2 diabetes mellitus. For You  Breastfeeding helps to create a very special bond between you and your baby.  Breastfeeding is convenient. Breast milk is always available at the correct temperature and costs nothing.  Breastfeeding helps to burn calories and helps you lose the weight gained during pregnancy.  Breastfeeding makes your uterus contract to its prepregnancy size faster and slows bleeding (lochia) after you give birth.   Breastfeeding helps to lower your risk of developing type 2 diabetes mellitus, osteoporosis, and breast or ovarian cancer later in life. SIGNS THAT YOUR BABY IS  HUNGRY Early Signs of Hunger  Increased alertness or activity.  Stretching.  Movement of the head from side to side.  Movement of the head and opening of the mouth when the corner of the mouth or cheek is stroked (rooting).  Increased sucking sounds, smacking lips, cooing, sighing, or squeaking.  Hand-to-mouth movements.  Increased sucking of fingers or hands. Late Signs of Hunger  Fussing.  Intermittent crying. Extreme Signs of Hunger Signs of extreme hunger will require calming and consoling before your baby will be able to breastfeed successfully. Do not wait for the following signs of extreme hunger to occur before you initiate breastfeeding:  Restlessness.  A loud, strong cry.  Screaming.   BREASTFEEDING BASICS Breastfeeding Initiation  Find a comfortable place to sit or lie down, with your neck and back well supported.  Place a pillow or rolled up blanket under your baby to bring him or her to the level of your breast (if you are seated). Nursing pillows are specially designed to help support your arms and your baby while you breastfeed.  Make sure that your baby's abdomen is facing your abdomen.  Gently massage your breast. With your fingertips, massage from your chest wall toward your nipple in a circular motion. This encourages milk flow. You may need to continue this action during the feeding if your milk flows slowly.  Support your breast with 4 fingers underneath and your thumb above your nipple. Make sure your fingers are well away from your nipple and your baby's mouth.  Stroke your baby's lips gently with your finger or nipple.  When your baby's mouth is open wide enough, quickly bring your baby to your breast, placing your entire nipple and as much of the colored area around your nipple (areola) as possible into your baby's mouth.  More areola should be visible above your baby's upper lip than below the lower lip.  Your baby's tongue should be between his  or her lower gum and your breast.  Ensure that your baby's mouth is correctly positioned around your nipple (latched). Your baby's lips should create a seal on your breast and be turned out (everted).  It is common for your baby to suck about 2-3 minutes in order to start the flow of breast milk. Latching Teaching your baby how to latch on to your breast properly is very important. An improper latch can cause nipple pain and decreased milk supply for you and poor weight gain in your baby. Also, if your baby is not latched onto your nipple properly, he or she may swallow some air during feeding. This can make your baby fussy. Burping your baby when you switch breasts during the feeding can help to get rid of the air. However, teaching your baby to latch on properly is still the best way to prevent fussiness from swallowing air while breastfeeding. Signs that your baby has successfully latched on to your nipple:  Silent tugging or silent sucking, without causing you pain.  Swallowing heard between every 3-4 sucks.  Muscle movement above and in front of his or her ears while sucking. Signs that your baby has not successfully latched on to nipple:  Sucking sounds or smacking sounds from your baby while breastfeeding.  Nipple pain. If you think your baby has not latched on correctly, slip your finger into the corner of your baby's mouth to break the suction and place it between your baby's gums. Attempt breastfeeding initiation again. Signs of Successful Breastfeeding Signs from your baby:  A gradual decrease in the number of sucks or complete cessation of sucking.  Falling asleep.  Relaxation of his or her body.  Retention of a small amount of milk in his or her mouth.  Letting go of your breast by himself or herself. Signs from you:  Breasts that have increased in firmness, weight, and size 1-3 hours after feeding.  Breasts that are softer immediately after  breastfeeding.  Increased milk volume, as well as a change in milk consistency and color by the fifth day of breastfeeding.  Nipples that are not sore, cracked, or bleeding. Signs That Your Baby is Getting Enough Milk  Wetting at least 3 diapers in a 24-hour period.   The urine should be clear and pale yellow by age 5 days.  At least 3 stools in a 24-hour period by age 5 days. The stool should be soft and yellow.  At least 3 stools in a 24-hour period by age 7 days. The stool should be seedy and yellow.  No loss of weight greater than 10% of birth weight during the first 3 days of age.  Average weight gain of 4-7 ounces (113-198 g) per week after age 4 days.  Consistent daily weight gain by age 5 days, without weight loss after the age of 2 weeks. After a feeding, your baby may spit up a small amount. This is common. BREASTFEEDING FREQUENCY AND DURATION Frequent feeding will help you make more milk and can prevent sore nipples and breast engorgement. Breastfeed when you feel the need to reduce the fullness of your breasts or when your baby shows signs of hunger. This is called "breastfeeding on demand." Avoid introducing a pacifier to your baby while you are working to establish breastfeeding (the first 4-6 weeks after your baby is born). After this time you may choose to use a pacifier. Research has shown that pacifier use during the first year of a baby's life decreases the risk of sudden infant death syndrome (SIDS). Allow your baby to feed on each breast as long as he or she wants. Breastfeed until your baby is finished feeding. When your baby unlatches or falls asleep while feeding from the first breast, offer the second breast. Because newborns are often sleepy in the first few weeks of life, you may need to awaken your baby to get him or her to feed. Breastfeeding times will vary from baby to baby. However, the following rules can serve as a guide to help you ensure that your baby is  properly fed:  Newborns (babies 4 weeks of age or younger) may breastfeed every 1-3 hours.  Newborns should not go longer than 3 hours during the day or 5 hours during the night without breastfeeding.  You should breastfeed your baby a minimum of 8 times in a 24-hour period until you begin to introduce solid foods to your baby at around 6 months of age. BREAST MILK PUMPING Pumping and storing breast milk allows you to ensure that your baby is exclusively fed your breast milk, even at times when you are unable to breastfeed. This is especially important if you are going back to work while you are still breastfeeding or when you are not able to be present during feedings. Your lactation consultant can give you guidelines on how long it is safe to store breast milk. A breast pump is a machine that allows you to pump milk from your breast into a sterile bottle. The pumped breast milk can then be stored in a refrigerator or freezer. Some breast pumps are operated by hand, while others use electricity. Ask your lactation consultant which type will work best for you. Breast pumps can be purchased, but some hospitals and breastfeeding support groups lease breast pumps on a monthly basis. A lactation consultant can teach you how to hand express breast milk, if you prefer not to use a pump. CARING FOR YOUR BREASTS WHILE YOU BREASTFEED Nipples can become dry, cracked, and sore while breastfeeding. The following recommendations can help keep your breasts moisturized and healthy:  Avoid using soap on your nipples.  Wear a supportive bra. Although not required, special nursing bras and tank tops are designed to allow access to your   breasts for breastfeeding without taking off your entire bra or top. Avoid wearing underwire-style bras or extremely tight bras.  Air dry your nipples for 3-4minutes after each feeding.  Use only cotton bra pads to absorb leaked breast milk. Leaking of breast milk between feedings  is normal.  Use lanolin on your nipples after breastfeeding. Lanolin helps to maintain your skin's normal moisture barrier. If you use pure lanolin, you do not need to wash it off before feeding your baby again. Pure lanolin is not toxic to your baby. You may also hand express a few drops of breast milk and gently massage that milk into your nipples and allow the milk to air dry. In the first few weeks after giving birth, some women experience extremely full breasts (engorgement). Engorgement can make your breasts feel heavy, warm, and tender to the touch. Engorgement peaks within 3-5 days after you give birth. The following recommendations can help ease engorgement:  Completely empty your breasts while breastfeeding or pumping. You may want to start by applying warm, moist heat (in the shower or with warm water-soaked hand towels) just before feeding or pumping. This increases circulation and helps the milk flow. If your baby does not completely empty your breasts while breastfeeding, pump any extra milk after he or she is finished.  Wear a snug bra (nursing or regular) or tank top for 1-2 days to signal your body to slightly decrease milk production.  Apply ice packs to your breasts, unless this is too uncomfortable for you.  Make sure that your baby is latched on and positioned properly while breastfeeding. If engorgement persists after 48 hours of following these recommendations, contact your health care provider or a lactation consultant. OVERALL HEALTH CARE RECOMMENDATIONS WHILE BREASTFEEDING  Eat healthy foods. Alternate between meals and snacks, eating 3 of each per day. Because what you eat affects your breast milk, some of the foods may make your baby more irritable than usual. Avoid eating these foods if you are sure that they are negatively affecting your baby.  Drink milk, fruit juice, and water to satisfy your thirst (about 10 glasses a day).  Rest often, relax, and continue to take  your prenatal vitamins to prevent fatigue, stress, and anemia.  Continue breast self-awareness checks.  Avoid chewing and smoking tobacco. Chemicals from cigarettes that pass into breast milk and exposure to secondhand smoke may harm your baby.  Avoid alcohol and drug use, including marijuana. Some medicines that may be harmful to your baby can pass through breast milk. It is important to ask your health care provider before taking any medicine, including all over-the-counter and prescription medicine as well as vitamin and herbal supplements. It is possible to become pregnant while breastfeeding. If birth control is desired, ask your health care provider about options that will be safe for your baby. SEEK MEDICAL CARE IF:  You feel like you want to stop breastfeeding or have become frustrated with breastfeeding.  You have painful breasts or nipples.  Your nipples are cracked or bleeding.  Your breasts are red, tender, or warm.  You have a swollen area on either breast.  You have a fever or chills.  You have nausea or vomiting.  You have drainage other than breast milk from your nipples.  Your breasts do not become full before feedings by the fifth day after you give birth.  You feel sad and depressed.  Your baby is too sleepy to eat well.  Your baby is having trouble sleeping.     Your baby is wetting less than 3 diapers in a 24-hour period.  Your baby has less than 3 stools in a 24-hour period.  Your baby's skin or the white part of his or her eyes becomes yellow.   Your baby is not gaining weight by 5 days of age. SEEK IMMEDIATE MEDICAL CARE IF:  Your baby is overly tired (lethargic) and does not want to wake up and feed.  Your baby develops an unexplained fever.   This information is not intended to replace advice given to you by your health care provider. Make sure you discuss any questions you have with your health care provider.   Document Released: 05/29/2005  Document Revised: 02/17/2015 Document Reviewed: 11/20/2012 Elsevier Interactive Patient Education 2016 [employer]  

## 2015-08-25 ENCOUNTER — Encounter (HOSPITAL_COMMUNITY): Payer: Self-pay | Admitting: *Deleted

## 2015-08-25 ENCOUNTER — Inpatient Hospital Stay (HOSPITAL_COMMUNITY)
Admission: AD | Admit: 2015-08-25 | Discharge: 2015-08-27 | DRG: 774 | Disposition: A | Discharge status: Home / Self Care | Payer: BC Managed Care – PPO | Source: Ambulatory Visit | Attending: Obstetrics & Gynecology | Admitting: Obstetrics & Gynecology

## 2015-08-25 ENCOUNTER — Inpatient Hospital Stay (HOSPITAL_COMMUNITY): Payer: BC Managed Care – PPO | Admitting: Anesthesiology

## 2015-08-25 DIAGNOSIS — B373 Candidiasis of vulva and vagina: Secondary | ICD-10-CM | POA: Diagnosis present

## 2015-08-25 DIAGNOSIS — Z3493 Encounter for supervision of normal pregnancy, unspecified, third trimester: Secondary | ICD-10-CM

## 2015-08-25 DIAGNOSIS — Z833 Family history of diabetes mellitus: Secondary | ICD-10-CM

## 2015-08-25 DIAGNOSIS — Z3490 Encounter for supervision of normal pregnancy, unspecified, unspecified trimester: Secondary | ICD-10-CM

## 2015-08-25 DIAGNOSIS — O9882 Other maternal infectious and parasitic diseases complicating childbirth: Secondary | ICD-10-CM | POA: Diagnosis present

## 2015-08-25 DIAGNOSIS — Z3A39 39 weeks gestation of pregnancy: Secondary | ICD-10-CM | POA: Diagnosis not present

## 2015-08-25 DIAGNOSIS — O4292 Full-term premature rupture of membranes, unspecified as to length of time between rupture and onset of labor: Secondary | ICD-10-CM | POA: Diagnosis present

## 2015-08-25 DIAGNOSIS — Z880 Allergy status to penicillin: Secondary | ICD-10-CM

## 2015-08-25 DIAGNOSIS — Z8249 Family history of ischemic heart disease and other diseases of the circulatory system: Secondary | ICD-10-CM

## 2015-08-25 HISTORY — DX: Cardiac murmur, unspecified: R01.1

## 2015-08-25 LAB — TYPE AND SCREEN
ABO/RH(D): A POS
ANTIBODY SCREEN: NEGATIVE

## 2015-08-25 LAB — CBC
HEMATOCRIT: 38.7 % (ref 36.0–46.0)
HEMOGLOBIN: 13.6 g/dL (ref 12.0–15.0)
MCH: 31.1 pg (ref 26.0–34.0)
MCHC: 35.1 g/dL (ref 30.0–36.0)
MCV: 88.4 fL (ref 78.0–100.0)
Platelets: 225 10*3/uL (ref 150–400)
RBC: 4.38 MIL/uL (ref 3.87–5.11)
RDW: 13.2 % (ref 11.5–15.5)
WBC: 21.3 10*3/uL — ABNORMAL HIGH (ref 4.0–10.5)

## 2015-08-25 LAB — POCT FERN TEST: POCT FERN TEST: POSITIVE

## 2015-08-25 LAB — ABO/RH: ABO/RH(D): A POS

## 2015-08-25 MED ORDER — LIDOCAINE HCL (PF) 1 % IJ SOLN
30.0000 mL | INTRAMUSCULAR | Status: DC | PRN
  Administered 2015-08-25: 30 mL via SUBCUTANEOUS
  Filled 2015-08-25: qty 30

## 2015-08-25 MED ORDER — FLEET ENEMA 7-19 GM/118ML RE ENEM
1.0000 | ENEMA | RECTAL | Status: DC | PRN
Start: 2015-08-25 — End: 2015-08-26

## 2015-08-25 MED ORDER — PHENYLEPHRINE 40 MCG/ML (10ML) SYRINGE FOR IV PUSH (FOR BLOOD PRESSURE SUPPORT)
80.0000 ug | PREFILLED_SYRINGE | INTRAVENOUS | Status: DC | PRN
  Filled 2015-08-25: qty 2

## 2015-08-25 MED ORDER — LORATADINE 10 MG PO TABS
10.0000 mg | ORAL_TABLET | Freq: Every day | ORAL | Status: DC
  Filled 2015-08-25: qty 1

## 2015-08-25 MED ORDER — FENTANYL 2.5 MCG/ML BUPIVACAINE 1/10 % EPIDURAL INFUSION (WH - ANES)
14.0000 mL/h | INTRAMUSCULAR | Status: DC | PRN
  Administered 2015-08-25 (×3): 14 mL/h via EPIDURAL
  Filled 2015-08-25 (×2): qty 125

## 2015-08-25 MED ORDER — OXYTOCIN 10 UNIT/ML IJ SOLN
1.0000 m[IU]/min | INTRAVENOUS | Status: DC
  Administered 2015-08-25: 1 m[IU]/min via INTRAVENOUS

## 2015-08-25 MED ORDER — OXYCODONE-ACETAMINOPHEN 5-325 MG PO TABS: 1.0000 | ORAL_TABLET | ORAL | Status: DC | PRN

## 2015-08-25 MED ORDER — OXYTOCIN BOLUS FROM INFUSION
500.0000 mL | INTRAVENOUS | Status: DC
  Administered 2015-08-25: 500 mL via INTRAVENOUS

## 2015-08-25 MED ORDER — OXYTOCIN 10 UNIT/ML IJ SOLN: 1.0000 m[IU]/min | INTRAVENOUS | Status: DC

## 2015-08-25 MED ORDER — OXYCODONE-ACETAMINOPHEN 5-325 MG PO TABS: 2.0000 | ORAL_TABLET | ORAL | Status: DC | PRN

## 2015-08-25 MED ORDER — EPHEDRINE 5 MG/ML INJ
10.0000 mg | INTRAVENOUS | Status: DC | PRN
  Filled 2015-08-25: qty 2

## 2015-08-25 MED ORDER — LACTATED RINGERS IV SOLN: 500.0000 mL | Freq: Once | INTRAVENOUS | Status: DC

## 2015-08-25 MED ORDER — TERBUTALINE SULFATE 1 MG/ML IJ SOLN
0.2500 mg | Freq: Once | INTRAMUSCULAR | Status: DC | PRN
  Filled 2015-08-25: qty 1

## 2015-08-25 MED ORDER — CITRIC ACID-SODIUM CITRATE 334-500 MG/5ML PO SOLN: 30.0000 mL | ORAL | Status: DC | PRN

## 2015-08-25 MED ORDER — LACTATED RINGERS IV SOLN
500.0000 mL | INTRAVENOUS | Status: DC | PRN
Start: 2015-08-25 — End: 2015-08-26

## 2015-08-25 MED ORDER — LACTATED RINGERS IV SOLN
INTRAVENOUS | Status: DC
  Administered 2015-08-25 (×2): via INTRAVENOUS

## 2015-08-25 MED ORDER — LIDOCAINE HCL (PF) 1 % IJ SOLN
INTRAMUSCULAR | Status: DC | PRN
  Administered 2015-08-25 (×2): 4 mL

## 2015-08-25 MED ORDER — DIPHENHYDRAMINE HCL 50 MG/ML IJ SOLN: 12.5000 mg | INTRAMUSCULAR | Status: DC | PRN

## 2015-08-25 MED ORDER — ACETAMINOPHEN 325 MG PO TABS: 650.0000 mg | ORAL_TABLET | ORAL | Status: DC | PRN

## 2015-08-25 MED ORDER — OXYTOCIN 10 UNIT/ML IJ SOLN
2.5000 [IU]/h | INTRAVENOUS | Status: DC
  Filled 2015-08-25: qty 10

## 2015-08-25 MED ORDER — ONDANSETRON HCL 4 MG/2ML IJ SOLN: 4.0000 mg | Freq: Four times a day (QID) | INTRAMUSCULAR | Status: DC | PRN

## 2015-08-25 NOTE — MAU Note (Signed)
Contractions woke her around 0330, getting closer and stronger. Membranes stripped yesterday, was 1+cm.  Small amt of bloody mucous.  Water broke about 0630, clear fluid

## 2015-08-25 NOTE — H&P (Signed)
LABOR AND DELIVERY ADMISSION HISTORY AND PHYSICAL NOTE  Kimberly Randolph is a 25 y.o. female G1P0 with IUP at [redacted]w[redacted]d by LMP presenting for SOL s/p SROM of term pregnancy.   She reports positive fetal movement. She denies leakage of fluid or vaginal bleeding. The patient started experiencing cramping and spontaneous leakage early this morning around 6 am. She denies blood or odor to the fluid.   Prenatal History/Complications: Uncomplicated course. Patient has been seen for appropriate prenatal care at [facility], attending all of her scheduled prenatal appointments. This is a low-risk pregnancy. The patient has a penicillin allergy- rash while in childhood.   Past Medical History: Past Medical History  Diagnosis Date  . Medical history non-contributory   . Heart murmur     Past Surgical History: Past Surgical History  Procedure Laterality Date  . Wisdom tooth extraction    . No past surgeries      Obstetrical History: OB History    Gravida Para Term Preterm AB TAB SAB Ectopic Multiple Living   1               Social History: Social History   Social History  . Marital Status: Married    Spouse Name: N/A  . Number of Children: N/A  . Years of Education: N/A   Social History Main Topics  . Smoking status: Never Smoker   . Smokeless tobacco: Never Used  . Alcohol Use: No  . Drug Use: No  . Sexual Activity: Yes    Birth Control/ Protection: None   Other Topics Concern  . None   Social History Narrative    Family History: Family History  Problem Relation Age of Onset  . Cancer Mother 30    Colon Cancer  . Cancer Father 33    Bladder  . Diabetes Father     Type 2  . Hypertension Father   . Birth defects Sister infancy    Heart valve defect  . Diabetes Paternal Grandmother   . Cancer Paternal Grandfather     Pancreatic    Allergies: Allergies  Allergen Reactions  . Penicillins Rash    Has patient had a PCN reaction causing immediate rash,  facial/tongue/throat swelling, SOB or lightheadedness with hypotension: unknown Has patient had a PCN reaction causing severe rash involving mucus membranes or skin necrosis: unknown Has patient had a PCN reaction that required hospitalization unknown Has patient had a PCN reaction occurring within the last 10 years: no If all of the above answers are "NO", then may proceed with Cephalosporin use.     Prescriptions prior to admission  Medication Sig Dispense Refill Last Dose  . cetirizine (ZYRTEC) 10 MG tablet Take 10 mg by mouth at bedtime. Reported on 08/09/2015   08/24/2015 at Unknown time  . Prenatal Vit-Fe Fumarate-FA (PRENATAL ONE DAILY PO) Take 1 tablet by mouth daily.    08/24/2015 at Unknown time  . terconazole (TERAZOL 3) 0.8 % vaginal cream Place 1 applicator vaginally at bedtime. 20 g 0 08/24/2015 at Unknown time     Review of Systems   All systems reviewed and negative except as stated in HPI.  Blood pressure 131/86, pulse 85, temperature 99.1 F (37.3 C), temperature source Axillary, resp. rate 20, height  (1.727 m), weight 210 lb (95.255 kg), last menstrual period 11/23/2014, SpO2 96 %. General appearance: alert, cooperative and appears stated age Lungs: clear to auscultation bilaterally Heart: regular rate and rhythm, clear S1/S2, grade II/VI systolic murmur heard best at  the left upper sternal border with no radiation to the carotids Abdomen: soft, non-tender; bowel sounds normal Extremities: no calf swelling or tenderness Presentation: cephalic; fundal height: 35 cm; presentation: vertex Fetal monitoring: fetal heart rate (bpm): 128 Uterine activity: movement: present Dilation: 6 Effacement (%): 90 Station: -1 Exam by:: Henderson NewcomerStephanie Faulk, RN   Prenatal labs: ABO, Rh: --/--/A POS (03/15 0800) Antibody: NEG (03/15 0800) Rubella: IMMUNE RPR: NON REAC (12/27 1053)  HBsAg: Negative (08/16 0000)  HIV: NONREACTIVE (12/27 1053)  GBS: negative 1 hr Glucola: third  trimester: 98 Genetic screening: quad - negative Anatomy US: normal  Prenatal Transfer Tool  Maternal Diabetes: No Genetic Screening: Normal Maternal Ultrasounds/Referrals: Normal Fetal Ultrasounds or other Referrals:  None Maternal Substance Abuse:  No Significant Maternal Medications:  Meds include: Other:  terconazole 0.8% cream for candida vaginitis started on 08/24/15 Significant Maternal Lab Results: None  Results for orders placed or performed during the hospital encounter of 08/25/15 (from the past 24 hour(s))  Fern Test   Collection Time: 08/25/15  7:36 AM  Result Value Ref Range   POCT Fern Test Positive = ruptured amniotic membanes   CBC   Collection Time: 08/25/15  8:00 AM  Result Value Ref Range   WBC 21.3 (H) 4.0 - 10.5 K/uL   RBC 4.38 3.87 - 5.11 MIL/uL   Hemoglobin 13.6 12.0 - 15.0 g/dL   HCT 13.238.7 44.036.0 - 10.246.0 %   MCV 88.4 78.0 - 100.0 fL   MCH 31.1 26.0 - 34.0 pg   MCHC 35.1 30.0 - 36.0 g/dL   RDW 72.513.2 36.611.5 - 44.015.5 %   Platelets 225 150 - 400 K/uL  Type and screen [facility]   Collection Time: 08/25/15  8:00 AM  Result Value Ref Range   ABO/RH(D) A POS    Antibody Screen NEG    Sample Expiration 08/28/2015     Patient Active Problem List   Diagnosis Date Noted  . Pregnancy 08/25/2015  . Supervision of normal pregnancy 01/26/2015    Assessment: Kimberly JuniperKatie Randolph is a 25 y.o. G1P0 at 5930w2d here for SOL s/p SROM of term pregnancy.  #Labor:  Expectant magagement. Consider augmentation with pitocin. #Pain: Epidural #FWB: Category 1 tracing  #ID:  GBS negative. Nursing concerned with vaginal candidal infection. Currently being treated with terconazole 0.8% cream. #MOF: Breast #MOC: IUD #Circ:  Interested in inpatient circ  Kimberly Randolph 08/25/2015, 9:19 AM  OB FELLOW HISTORY AND PHYSICAL ATTESTATION  I have seen and examined this patient; I agree with above documentation in the resident's note.    Cherrie Gauzeoah B Stoney Karczewski 08/25/2015, 1:48  PM  \

## 2015-08-25 NOTE — Anesthesia Procedure Notes (Signed)
Epidural Patient location during procedure: OB  Staffing Anesthesiologist: Coltan Spinello Performed by: anesthesiologist   Preanesthetic Checklist Completed: patient identified, site marked, surgical consent, pre-op evaluation, timeout performed, IV checked, risks and benefits discussed and monitors and equipment checked  Epidural Patient position: sitting Prep: site prepped and draped and DuraPrep Patient monitoring: continuous pulse ox and blood pressure Approach: midline Location: L3-L4 Injection technique: LOR saline  Needle:  Needle type: Tuohy  Needle gauge: 17 G Needle length: 9 cm and 9 Needle insertion depth: 7 cm Catheter type: closed end flexible Catheter size: 19 Gauge Catheter at skin depth: 12 cm Test dose: negative  Assessment Events: blood not aspirated, injection not painful, no injection resistance, negative IV test and no paresthesia  Additional Notes Patient identified. Risks/Benefits/Options discussed with patient including but not limited to bleeding, infection, nerve damage, paralysis, failed block, incomplete pain control, headache, blood pressure changes, nausea, vomiting, reactions to medication both or allergic, itching and postpartum back pain. Confirmed with bedside nurse the patient's most recent platelet count. Confirmed with patient that they are not currently taking any anticoagulation, have any bleeding history or any family history of bleeding disorders. Patient expressed understanding and wished to proceed. All questions were answered. Sterile technique was used throughout the entire procedure. Please see nursing notes for vital signs. Test dose was given through epidural catheter and negative prior to continuing to dose epidural or start infusion. Warning signs of high block given to the patient including shortness of breath, tingling/numbness in hands, complete motor block, or any concerning symptoms with instructions to call for help. Patient was  given instructions on fall risk and not to get out of bed. All questions and concerns addressed with instructions to call with any issues or inadequate analgesia.      

## 2015-08-25 NOTE — Anesthesia Preprocedure Evaluation (Signed)
Anesthesia Evaluation  Patient identified by MRN, date of birth, ID band Patient awake    Reviewed: Allergy & Precautions, NPO status , Patient's Chart, lab work & pertinent test results  History of Anesthesia Complications Negative for: history of anesthetic complications  Airway Mallampati: II  TM Distance: >3 FB Neck ROM: Full    Dental no notable dental hx. (+) Dental Advisory Given   Pulmonary neg pulmonary ROS,    Pulmonary exam normal breath sounds clear to auscultation       Cardiovascular negative cardio ROS Normal cardiovascular exam Rhythm:Regular Rate:Normal     Neuro/Psych negative neurological ROS  negative psych ROS   GI/Hepatic negative GI ROS, Neg liver ROS,   Endo/Other  negative endocrine ROSobesity  Renal/GU negative Renal ROS  negative genitourinary   Musculoskeletal negative musculoskeletal ROS (+)   Abdominal   Peds negative pediatric ROS (+)  Hematology negative hematology ROS (+)   Anesthesia Other Findings   Reproductive/Obstetrics (+) Pregnancy                             Anesthesia Physical Anesthesia Plan  ASA: II  Anesthesia Plan: Epidural   Post-op Pain Management:    Induction:   Airway Management Planned:   Additional Equipment:   Intra-op Plan:   Post-operative Plan:   Informed Consent: I have reviewed the patients History and Physical, chart, labs and discussed the procedure including the risks, benefits and alternatives for the proposed anesthesia with the patient or authorized representative who has indicated his/her understanding and acceptance.   Dental advisory given  Plan Discussed with: CRNA  Anesthesia Plan Comments:         Anesthesia Quick Evaluation  

## 2015-08-25 NOTE — Progress Notes (Signed)
Dr Ashok PallWouk notified of laboring down status. Patient attempted to push a few contractions but no movement was noted. Pt complaining of left sided pain with minimal pushing effort as well.

## 2015-08-26 DIAGNOSIS — Z3A39 39 weeks gestation of pregnancy: Secondary | ICD-10-CM

## 2015-08-26 DIAGNOSIS — O4292 Full-term premature rupture of membranes, unspecified as to length of time between rupture and onset of labor: Secondary | ICD-10-CM

## 2015-08-26 LAB — CBC
HEMATOCRIT: 28.9 % — AB (ref 36.0–46.0)
Hemoglobin: 9.9 g/dL — ABNORMAL LOW (ref 12.0–15.0)
MCH: 30.6 pg (ref 26.0–34.0)
MCHC: 34.3 g/dL (ref 30.0–36.0)
MCV: 89.2 fL (ref 78.0–100.0)
Platelets: 181 10*3/uL (ref 150–400)
RBC: 3.24 MIL/uL — AB (ref 3.87–5.11)
RDW: 13.5 % (ref 11.5–15.5)
WBC: 33.8 10*3/uL — AB (ref 4.0–10.5)

## 2015-08-26 LAB — RPR: RPR: NONREACTIVE

## 2015-08-26 MED ORDER — METHYLERGONOVINE MALEATE 0.2 MG PO TABS: 0.2000 mg | ORAL_TABLET | ORAL | Status: DC | PRN

## 2015-08-26 MED ORDER — SIMETHICONE 80 MG PO CHEW: 80.0000 mg | CHEWABLE_TABLET | ORAL | Status: DC | PRN

## 2015-08-26 MED ORDER — ZOLPIDEM TARTRATE 5 MG PO TABS: 5.0000 mg | ORAL_TABLET | Freq: Every evening | ORAL | Status: DC | PRN

## 2015-08-26 MED ORDER — METHYLERGONOVINE MALEATE 0.2 MG/ML IJ SOLN: 0.2000 mg | INTRAMUSCULAR | Status: DC | PRN

## 2015-08-26 MED ORDER — PRENATAL MULTIVITAMIN CH: 1.0000 | ORAL_TABLET | Freq: Every day | ORAL | Status: DC

## 2015-08-26 MED ORDER — OXYCODONE-ACETAMINOPHEN 5-325 MG PO TABS
1.0000 | ORAL_TABLET | ORAL | Status: DC | PRN
  Administered 2015-08-26: 1 via ORAL
  Filled 2015-08-26 (×3): qty 1

## 2015-08-26 MED ORDER — DIBUCAINE 1 % RE OINT
1.0000 | TOPICAL_OINTMENT | RECTAL | Status: DC | PRN
Start: 2015-08-26 — End: 2015-08-26

## 2015-08-26 MED ORDER — DIPHENHYDRAMINE HCL 25 MG PO CAPS
25.0000 mg | ORAL_CAPSULE | Freq: Four times a day (QID) | ORAL | Status: DC | PRN
Start: 2015-08-26 — End: 2015-08-26

## 2015-08-26 MED ORDER — LANOLIN HYDROUS EX OINT
TOPICAL_OINTMENT | CUTANEOUS | Status: DC | PRN
Start: 2015-08-26 — End: 2015-08-26

## 2015-08-26 MED ORDER — IBUPROFEN 600 MG PO TABS
600.0000 mg | ORAL_TABLET | Freq: Four times a day (QID) | ORAL | Status: DC
  Administered 2015-08-26: 600 mg via ORAL
  Filled 2015-08-26: qty 1

## 2015-08-26 MED ORDER — METHYLERGONOVINE MALEATE 0.2 MG/ML IJ SOLN
0.2000 mg | INTRAMUSCULAR | Status: DC | PRN
Start: 2015-08-26 — End: 2015-08-27

## 2015-08-26 MED ORDER — DOCUSATE SODIUM 100 MG PO CAPS
100.0000 mg | ORAL_CAPSULE | Freq: Two times a day (BID) | ORAL | Status: DC
  Administered 2015-08-26 – 2015-08-27 (×2): 100 mg via ORAL
  Filled 2015-08-26 (×2): qty 1

## 2015-08-26 MED ORDER — FLEET ENEMA 7-19 GM/118ML RE ENEM: 1.0000 | ENEMA | Freq: Every day | RECTAL | Status: DC | PRN

## 2015-08-26 MED ORDER — ONDANSETRON HCL 4 MG/2ML IJ SOLN
4.0000 mg | INTRAMUSCULAR | Status: DC | PRN
Start: 2015-08-26 — End: 2015-08-26

## 2015-08-26 MED ORDER — TETANUS-DIPHTH-ACELL PERTUSSIS 5-2.5-18.5 LF-MCG/0.5 IM SUSP
0.5000 mL | Freq: Once | INTRAMUSCULAR | Status: DC
Start: 2015-08-27 — End: 2015-08-27

## 2015-08-26 MED ORDER — METHYLERGONOVINE MALEATE 0.2 MG/ML IJ SOLN
0.2000 mg | INTRAMUSCULAR | Status: DC | PRN
Start: 2015-08-26 — End: 2015-08-26

## 2015-08-26 MED ORDER — MEASLES, MUMPS & RUBELLA VAC ~~LOC~~ INJ
0.5000 mL | INJECTION | Freq: Once | SUBCUTANEOUS | Status: DC
  Filled 2015-08-26: qty 0.5

## 2015-08-26 MED ORDER — TETANUS-DIPHTH-ACELL PERTUSSIS 5-2.5-18.5 LF-MCG/0.5 IM SUSP: 0.5000 mL | Freq: Once | INTRAMUSCULAR | Status: DC

## 2015-08-26 MED ORDER — WITCH HAZEL-GLYCERIN EX PADS: 1.0000 "application " | MEDICATED_PAD | CUTANEOUS | Status: DC | PRN

## 2015-08-26 MED ORDER — BENZOCAINE-MENTHOL 20-0.5 % EX AERO: 1.0000 "application " | INHALATION_SPRAY | CUTANEOUS | Status: DC | PRN

## 2015-08-26 MED ORDER — BISACODYL 10 MG RE SUPP: 10.0000 mg | Freq: Every day | RECTAL | Status: DC | PRN

## 2015-08-26 MED ORDER — OXYCODONE-ACETAMINOPHEN 5-325 MG PO TABS
2.0000 | ORAL_TABLET | ORAL | Status: DC | PRN
  Administered 2015-08-26: 2 via ORAL

## 2015-08-26 MED ORDER — ACETAMINOPHEN 325 MG PO TABS: 650.0000 mg | ORAL_TABLET | ORAL | Status: DC | PRN

## 2015-08-26 MED ORDER — ONDANSETRON HCL 4 MG PO TABS
4.0000 mg | ORAL_TABLET | ORAL | Status: DC | PRN
Start: 2015-08-26 — End: 2015-08-26

## 2015-08-26 MED ORDER — BENZOCAINE-MENTHOL 20-0.5 % EX AERO
1.0000 "application " | INHALATION_SPRAY | CUTANEOUS | Status: DC | PRN
  Filled 2015-08-26: qty 56

## 2015-08-26 MED ORDER — SENNOSIDES-DOCUSATE SODIUM 8.6-50 MG PO TABS: 2.0000 | ORAL_TABLET | ORAL | Status: DC

## 2015-08-26 MED ORDER — DIPHENHYDRAMINE HCL 25 MG PO CAPS: 25.0000 mg | ORAL_CAPSULE | Freq: Four times a day (QID) | ORAL | Status: DC | PRN

## 2015-08-26 MED ORDER — LANOLIN HYDROUS EX OINT: TOPICAL_OINTMENT | CUTANEOUS | Status: DC | PRN

## 2015-08-26 MED ORDER — SIMETHICONE 80 MG PO CHEW
80.0000 mg | CHEWABLE_TABLET | ORAL | Status: DC | PRN
Start: 2015-08-26 — End: 2015-08-26

## 2015-08-26 MED ORDER — DIBUCAINE 1 % RE OINT: 1.0000 "application " | TOPICAL_OINTMENT | RECTAL | Status: DC | PRN

## 2015-08-26 MED ORDER — ONDANSETRON HCL 4 MG/2ML IJ SOLN: 4.0000 mg | INTRAMUSCULAR | Status: DC | PRN

## 2015-08-26 MED ORDER — OXYTOCIN 10 UNIT/ML IJ SOLN: 2.5000 [IU]/h | INTRAVENOUS | Status: DC | PRN

## 2015-08-26 MED ORDER — ONDANSETRON HCL 4 MG PO TABS: 4.0000 mg | ORAL_TABLET | ORAL | Status: DC | PRN

## 2015-08-26 MED ORDER — IBUPROFEN 600 MG PO TABS
600.0000 mg | ORAL_TABLET | Freq: Four times a day (QID) | ORAL | Status: DC
  Administered 2015-08-26 – 2015-08-27 (×5): 600 mg via ORAL
  Filled 2015-08-26 (×5): qty 1

## 2015-08-26 MED ORDER — SENNOSIDES-DOCUSATE SODIUM 8.6-50 MG PO TABS
2.0000 | ORAL_TABLET | ORAL | Status: DC
  Administered 2015-08-27: 2 via ORAL
  Filled 2015-08-26: qty 2

## 2015-08-26 MED ORDER — IBUPROFEN 600 MG PO TABS: 600.0000 mg | ORAL_TABLET | Freq: Four times a day (QID) | ORAL | Status: DC

## 2015-08-26 MED ORDER — METHYLERGONOVINE MALEATE 0.2 MG PO TABS
0.2000 mg | ORAL_TABLET | ORAL | Status: DC | PRN
Start: 2015-08-26 — End: 2015-08-26

## 2015-08-26 MED ORDER — PRENATAL MULTIVITAMIN CH
1.0000 | ORAL_TABLET | Freq: Every day | ORAL | Status: DC
  Administered 2015-08-26 – 2015-08-27 (×2): 1 via ORAL
  Filled 2015-08-26 (×2): qty 1

## 2015-08-26 NOTE — Progress Notes (Signed)
POSTPARTUM PROGRESS NOTE  Post Partum Day 1 Subjective:  Kimberly Randolph is a 25 y.o. G1P1 PPD#1 s/p SVD at 2330.  No acute events overnight.  Pt denies problems with ambulating, voiding or po intake.  She denies nausea or vomiting.  Pain is well controlled, though increases from 1/10 to 3/10 with ambulation.  She has had flatus. She has not had bowel movement.  Lochia Small.   Objective: Blood pressure 125/79, pulse 76, temperature 98.2 F (36.8 C), temperature source Oral, resp. rate 16, height 5\' 8"  (1.727 m), weight 95.255 kg (210 lb), last menstrual period 11/23/2014, SpO2 96 %.  Physical Exam:  General: alert, cooperative and no distress Lochia:normal flow DVT Evaluation: No calf swelling or tenderness Extremities: trace edema   Recent Labs  08/25/15 0800  HGB 13.6  HCT 38.7    Assessment/Plan:  ASSESSMENT: Kimberly JuniperKatie Pooler is a 25 y.o. G1P0 2172w3d s/p SVD at 2330. Pt is stable and has no complaints. Plan for discharge tomorrow, Patient plans to breastfeed, Circumcision prior to discharge.  Contraception progestin-only pills.  Lucretia KernJohn Sicily Zaragoza 08/26/2015, 6:55 AM

## 2015-08-26 NOTE — Anesthesia Postprocedure Evaluation (Signed)
Anesthesia Post Note  Patient: [name]  Procedure(s) Performed: * No procedures listed *  Patient location during evaluation: Mother Baby Anesthesia Type: Epidural Level of consciousness: awake, awake and alert, oriented and patient cooperative Pain management: pain level controlled Vital Signs Assessment: post-procedure vital signs reviewed and stable Respiratory status: spontaneous breathing, nonlabored ventilation and respiratory function stable Cardiovascular status: stable Postop Assessment: no headache, no backache, patient able to bend at knees and no signs of nausea or vomiting Anesthetic complications: no    Last Vitals:  Filed Vitals:   08/26/15 0300 08/26/15 0631  BP: 125/56 125/79  Pulse: 85 76  Temp: 36.8 C   Resp: 18 16    Last Pain:  Filed Vitals:   08/26/15 0932  PainSc: 5                  Lief Palmatier L

## 2015-08-26 NOTE — Lactation Note (Signed)
This note was copied from a baby's chart. Lactation Consultation Note  Patient Name: Kimberly Randolph NWGNF'AToday's Date: 08/26/2015 Reason for consult: Initial assessment  Baby 17 hours old. Mom reports that baby is nursing well, and she has received assistance with latching the baby several times from her nurses. Mom asked how to know baby getting enough at breast. Discussed milk coming to volume, cluster-feeding, and ways of knowing baby swallowing and getting satisfied at breast. Enc mom to nurse with cues and offer lots of STS. [facility], aware of OP/BFSG and LC phone line assistance after D/C.  Maternal Data Does the patient have breastfeeding experience prior to this delivery?: No  Feeding Feeding Type: Breast Fed Length of feed: 25 min  LATCH Score/Interventions                      Lactation Tools Discussed/Used     Consult Status Consult Status: Follow-up Date: 08/27/15 Follow-up type: In-patient    Geralynn OchsWILLIARD, Vaun Hyndman 08/26/2015, 5:29 PM

## 2015-08-27 ENCOUNTER — Encounter (HOSPITAL_COMMUNITY): Payer: Self-pay | Admitting: *Deleted

## 2015-08-27 MED ORDER — NORETHINDRONE 0.35 MG PO TABS: 1.0000 | ORAL_TABLET | Freq: Every day | ORAL | Status: DC

## 2015-08-27 MED ORDER — DOCUSATE SODIUM 100 MG PO CAPS: 100.0000 mg | ORAL_CAPSULE | Freq: Two times a day (BID) | ORAL | Status: DC

## 2015-08-27 MED ORDER — IBUPROFEN 600 MG PO TABS: 600.0000 mg | ORAL_TABLET | Freq: Four times a day (QID) | ORAL | Status: DC

## 2015-08-27 MED ORDER — OXYCODONE-ACETAMINOPHEN 5-325 MG PO TABS: 1.0000 | ORAL_TABLET | Freq: Four times a day (QID) | ORAL | Status: DC | PRN

## 2015-08-27 NOTE — Lactation Note (Signed)
This note was copied from a baby's chart. Lactation Consultation Note  Baby recently circumcised.  Reviewed waking techniques and encouraged STS. Reviewed hand expression and mother was able to express good flow of colostrum. Discussed basics, answered mother's questions, Mom encouraged to feed baby 8-12 times/24 hours and with feeding cues.     Patient Name: Kimberly Randolph DGLOV'FToday's Date: 08/27/2015 Reason for consult: Follow-up assessment   Maternal Data Has patient been taught Hand Expression?: Yes  Feeding Feeding Type: Breast Milk Length of feed: 5 min  LATCH Score/Interventions                      Lactation Tools Discussed/Used     Consult Status Consult Status: Follow-up Date: 08/28/15 Follow-up type: In-patient    [name], [name] 08/27/2015, 11:19 AM

## 2015-08-27 NOTE — Discharge Summary (Signed)
OB Discharge Summary     Patient Name: Kimberly Randolph DOB: 24-Mar-1991 MRN: [id number]  Date of admission: 08/25/2015 Delivering MD: Lazaro Arms   Date of discharge: 08/27/2015  Admitting diagnosis: 39 WEEKS CTX Intrauterine pregnancy: [redacted]w[redacted]d     Secondary diagnosis:  Active Problems:   Pregnancy   NSVD (normal spontaneous vaginal delivery)  Additional problems: none     Discharge diagnosis: Term Pregnancy Delivered                                                                                                Post partum procedures:nione  Augmentation: n/a  Complications: None  Hospital course:  Onset of Labor With Vaginal Delivery     25 y.o. yo G1P1001 at [redacted]w[redacted]d was admitted in Active Labor on 08/25/2015. Patient had an uncomplicated labor course as follows:  Membrane Rupture Time/Date: 6:30 AM ,08/25/2015   Intrapartum Procedures: Episiotomy: Median [2]                                         Lacerations:  4th degree [5]  Patient had a delivery of a Viable infant. 08/25/2015  Information for the patient's newborn:  Kimberly Randolph [[id number]]  Delivery Method: Vaginal, Vacuum (Extractor) (Filed from Delivery Summary)     Pateint had an uncomplicated postpartum course.  She is ambulating, tolerating a regular diet, passing flatus, and urinating well. Patient is discharged home in stable condition on 08/27/2015.    Physical exam  Filed Vitals:   08/26/15 0631 08/26/15 1530 08/26/15 1834 08/27/15 0503  BP: 125/79 115/72 120/79 109/52  Pulse: 76 98 80 88  Temp:  97.6 F (36.4 C) 98 F (36.7 C) 98.4 F (36.9 C)  TempSrc:  Oral Oral Oral  Resp: Height:      Weight:      SpO2:    98%   General: alert, cooperative and no distress Lochia: appropriate Uterine Fundus: firm Incision: N/A DVT Evaluation: No evidence of DVT seen on physical exam. Labs: Lab Results  Component Value Date   WBC 33.8* 08/26/2015   HGB 9.9* 08/26/2015   HCT 28.9* 08/26/2015    MCV 89.2 08/26/2015   PLT 181 08/26/2015   CMP Latest Ref Rng 08/12/2015  Glucose 65 - 99 mg/dL 76  BUN 7 - 25 mg/dL 8  Creatinine 5.36 - 6.44 mg/dL 0.34  Sodium 742 - 595 mmol/L 137  Potassium 3.5 - 5.3 mmol/L 4.3  Chloride 98 - 110 mmol/L 107  CO2 20 - 31 mmol/L 21  Calcium 8.6 - 10.2 mg/dL 6.3(O)  Total Protein 6.1 - 8.1 g/dL 5.1(L)  Total Bilirubin 0.2 - 1.2 mg/dL 0.4  Alkaline Phos 33 - 115 U/L 159(H)  AST 10 - 30 U/L 20  ALT 6 - 29 U/L 11    Discharge instruction: per After Visit Summary and "Baby and Me Booklet".  After visit meds:    Medication List    STOP taking these medications  cetirizine 10 MG tablet  Commonly known as:  ZYRTEC     terconazole 0.8 % vaginal cream  Commonly known as:  TERAZOL 3      TAKE these medications        ibuprofen 600 MG tablet  Commonly known as:  ADVIL,MOTRIN  Take 1 tablet (600 mg total) by mouth every 6 (six) hours.     norethindrone 0.35 MG tablet  Commonly known as:  ORTHO MICRONOR  Take 1 tablet (0.35 mg total) by mouth daily.     PRENATAL ONE DAILY PO  Take 1 tablet by mouth daily.        Diet: routine diet  Activity: Advance as tolerated. Pelvic rest for 6 weeks.   Outpatient follow up:6 weeks Follow up Appt: Future Appointments Date Time Provider Department Center  10/05/2015 1:30 PM Tereso NewcomerUgonna A Anyanwu, MD [facility]   Follow up Visit:No Follow-up on file.  Postpartum contraception: Progesterone only pills  Newborn Data: Live born female  Birth Weight: 8 lb 9 oz (3884 g) APGAR: 9, 9  Baby Feeding: Breast Disposition:home with mother   08/27/2015 Kimberly Randolph, CNM

## 2015-08-27 NOTE — Progress Notes (Signed)
POSTPARTUM PROGRESS NOTE  Post Partum Day 2 Subjective:  Kimberly Randolph is a 25 y.o. G1P0 7446w4d s/p SVD with vacuum assist.  No acute events overnight.  Pt denies problems with ambulating, voiding or po intake.  She denies nausea or vomiting.  Pain is well controlled with ibuprofen overnight.  She has had flatus. She has not had bowel movement.  Lochia Minimal.   Objective: Blood pressure 109/52, pulse 88, temperature 98.4 F (36.9 C), temperature source Oral, resp. rate 16, height 5\' 8"  (1.727 m), weight 95.255 kg (210 lb), last menstrual period 11/23/2014, SpO2 98 %.  Physical Exam:  General: alert, cooperative and no distress   Recent Labs  08/25/15 0800 08/26/15 0622  HGB 13.6 9.9*  HCT 38.7 28.9*    Assessment/Plan:  ASSESSMENT: Kimberly JuniperKatie Calmes is a 25 y.o. G1P0 846w4d s/p SVD with vacuum assist.  Discharge home and Circumcision prior to discharge   LOS: 2 days   Lucretia KernJohn Nishtha Raider 08/27/2015, 7:13 AM

## 2015-08-27 NOTE — Discharge Instructions (Signed)

## 2015-08-31 ENCOUNTER — Encounter: Payer: BC Managed Care – PPO | Admitting: Obstetrics & Gynecology

## 2015-09-06 ENCOUNTER — Encounter (INDEPENDENT_AMBULATORY_CARE_PROVIDER_SITE_OTHER): Payer: BC Managed Care – PPO | Admitting: *Deleted

## 2015-09-06 DIAGNOSIS — Z3403 Encounter for supervision of normal first pregnancy, third trimester: Secondary | ICD-10-CM

## 2015-09-06 NOTE — Progress Notes (Signed)
FMLA forms completed for maternity leave.  

## 2015-10-05 ENCOUNTER — Encounter: Payer: Self-pay | Admitting: Obstetrics & Gynecology

## 2015-10-05 ENCOUNTER — Ambulatory Visit (INDEPENDENT_AMBULATORY_CARE_PROVIDER_SITE_OTHER): Payer: BC Managed Care – PPO | Admitting: Obstetrics & Gynecology

## 2015-10-05 DIAGNOSIS — F53 Postpartum depression: Secondary | ICD-10-CM

## 2015-10-05 DIAGNOSIS — F419 Anxiety disorder, unspecified: Secondary | ICD-10-CM

## 2015-10-05 DIAGNOSIS — Z3009 Encounter for other general counseling and advice on contraception: Secondary | ICD-10-CM

## 2015-10-05 DIAGNOSIS — F329 Major depressive disorder, single episode, unspecified: Secondary | ICD-10-CM

## 2015-10-05 DIAGNOSIS — O99345 Other mental disorders complicating the puerperium: Secondary | ICD-10-CM

## 2015-10-05 DIAGNOSIS — F418 Other specified anxiety disorders: Secondary | ICD-10-CM

## 2015-10-05 DIAGNOSIS — Z30011 Encounter for initial prescription of contraceptive pills: Secondary | ICD-10-CM

## 2015-10-05 MED ORDER — NORGESTIMATE-ETH ESTRADIOL 0.25-35 MG-MCG PO TABS: 1.0000 | ORAL_TABLET | Freq: Every day | ORAL | Status: DC

## 2015-10-05 MED ORDER — HYDROXYZINE HCL 25 MG PO TABS: 25.0000 mg | ORAL_TABLET | Freq: Three times a day (TID) | ORAL | Status: DC | PRN

## 2015-10-05 MED ORDER — SERTRALINE HCL 50 MG PO TABS: 50.0000 mg | ORAL_TABLET | Freq: Every day | ORAL | Status: DC

## 2015-10-05 NOTE — Progress Notes (Signed)
  Subjective:     Kimberly Randolph is a 25 y.o. 421P1001 female who presents for a postpartum visit accompanied by her husband. She is 6 weeks postpartum following a VAVD. I have fully reviewed the prenatal and intrapartum course. The delivery was at 39 3/7 gestational weeks.  Anesthesia: epidural. Postpartum course has been complicated by fourth degree laceration; patient denies any pain. Also denies any flatal or stool incontinence.  Postpartum depression screening: score of 9. Patient also reports being depressed and anxious, cries all the time. Symptoms are not improving.  No SI/HI. Desires medication to help.   Baby's course has been uncomplicated. Baby is feeding by bottle. Bleeding no bleeding. Bowel function is normal. Bladder function is normal. Patient is not sexually active. Contraception method is OCP (estrogen/progesterone).   The following portions of the patient's history were reviewed and updated as appropriate: allergies, current medications, past family history, past medical history, past social history, past surgical history and problem list.  Had normal pap in Jan 2016 at outside office.  Review of Systems Pertinent items noted in HPI and remainder of comprehensive ROS otherwise negative.   Objective:    BP 104/70 mmHg  Pulse 89  Resp 18  Ht 5\' 8"  (1.727 m)  Wt 186 lb (84.369 kg)  BMI 28.29 kg/m2  LMP 09/26/2015  Breastfeeding? No  General:  alert and no distress   Breasts:  deferred  Lungs: clear to auscultation bilaterally  Heart:  regular rate and rhythm  Abdomen: soft, non-tender; bowel sounds normal; no masses,  no organomegaly   Vulva:  normal and well-healed laceration        Assessment:   Normal postpartum exam. Pap smear not done at today's visit.   Plan:   1. Contraception: OCP (estrogen/progesterone) prescribed 2. Discussed management of depression and anxiety; counseling recommended. She declines for now.  Will start Zoloft 50 mg daily, reevaluate in 3  weeks. Also prescribed Atarax.   Patient told to call/come in for worsening symptoms.   Jaynie CollinsUGONNA  Akari Crysler, MD, FACOG Attending Obstetrician & Gynecologist, [facility]

## 2015-10-05 NOTE — Patient Instructions (Signed)
Postpartum Depression and Baby Blues °The postpartum period begins right after the birth of a baby. During this time, there is often a great amount of joy and excitement. It is also a time of many changes in the life of the parents. Regardless of how many times a mother gives birth, each child brings new challenges and dynamics to the family. It is not unusual to have feelings of excitement along with confusing shifts in moods, emotions, and thoughts. All mothers are at risk of developing postpartum depression or the "baby blues." These mood changes can occur right after giving birth, or they may occur many months after giving birth. The baby blues or postpartum depression can be mild or severe. Additionally, postpartum depression can go away rather quickly, or it can be a long-term condition.  °CAUSES °Raised hormone levels and the rapid drop in those levels are thought to be a main cause of postpartum depression and the baby blues. A number of hormones change during and after pregnancy. Estrogen and progesterone usually decrease right after the delivery of your baby. The levels of thyroid hormone and various cortisol steroids also rapidly drop. Other factors that play a role in these mood changes include major life events and genetics.  °RISK FACTORS °If you have any of the following risks for the baby blues or postpartum depression, know what symptoms to watch out for during the postpartum period. Risk factors that may increase the likelihood of getting the baby blues or postpartum depression include: °· Having a personal or family history of depression.   °· Having depression while being pregnant.   °· Having premenstrual mood issues or mood issues related to oral contraceptives. °· Having a lot of life stress.   °· Having marital conflict.   °· Lacking a social support network.   °· Having a baby with special needs.   °· Having health problems, such as diabetes.   °SIGNS AND SYMPTOMS °Symptoms of baby blues  include: °· Brief changes in mood, such as going from extreme happiness to sadness. °· Decreased concentration.   °· Difficulty sleeping.   °· Crying spells, tearfulness.   °· Irritability.   °· Anxiety.   °Symptoms of postpartum depression typically begin within the first month after giving birth. These symptoms include: °· Difficulty sleeping or excessive sleepiness.   °· Marked weight loss.   °· Agitation.   °· Feelings of worthlessness.   °· Lack of interest in activity or food.   °Postpartum psychosis is a very serious condition and can be dangerous. Fortunately, it is rare. Displaying any of the following symptoms is cause for immediate medical attention. Symptoms of postpartum psychosis include:  °· Hallucinations and delusions.   °· Bizarre or disorganized behavior.   °· Confusion or disorientation.   °DIAGNOSIS  °A diagnosis is made by an evaluation of your symptoms. There are no medical or lab tests that lead to a diagnosis, but there are various questionnaires that a health care provider may use to identify those with the baby blues, postpartum depression, or psychosis. Often, a screening tool called the Edinburgh Postnatal Depression Scale is used to diagnose depression in the postpartum period.  °TREATMENT °The baby blues usually goes away on its own in 1-2 weeks. Social support is often all that is needed. You will be encouraged to get adequate sleep and rest. Occasionally, you may be given medicines to help you sleep.  °Postpartum depression requires treatment because it can last several months or longer if it is not treated. Treatment may include individual or group therapy, medicine, or both to address any social, physiological, and psychological   factors that may play a role in the depression. Regular exercise, a healthy diet, rest, and social support may also be strongly recommended.  °Postpartum psychosis is more serious and needs treatment right away. Hospitalization is often needed. °HOME CARE  INSTRUCTIONS °· Get as much rest as you can. Nap when the baby sleeps.   °· Exercise regularly. Some women find yoga and walking to be beneficial.   °· Eat a balanced and nourishing diet.   °· Do little things that you enjoy. Have a cup of tea, take a bubble bath, read your favorite magazine, or listen to your favorite music. °· Avoid alcohol.   °· Ask for help with household chores, cooking, grocery shopping, or running errands as needed. Do not try to do everything.   °· Talk to people close to you about how you are feeling. Get support from your partner, family members, friends, or other new moms. °· Try to stay positive in how you think. Think about the things you are grateful for.   °· Do not spend a lot of time alone.   °· Only take over-the-counter or prescription medicine as directed by your health care provider. °· Keep all your postpartum appointments.   °· Let your health care provider know if you have any concerns.   °SEEK MEDICAL CARE IF: °You are having a reaction to or problems with your medicine. °SEEK IMMEDIATE MEDICAL CARE IF: °· You have suicidal feelings.   °· You think you may harm the baby or someone else. °MAKE SURE YOU: °· Understand these instructions. °· Will watch your condition. °· Will get help right away if you are not doing well or get worse. °  °This information is not intended to replace advice given to you by your health care provider. Make sure you discuss any questions you have with your health care provider. °  °Document Released: 03/02/2004 Document Revised: 06/03/2013 Document Reviewed: 03/10/2013 °Elsevier Interactive Patient Education ©2016 Elsevier Inc. ° °

## 2015-10-25 ENCOUNTER — Ambulatory Visit (INDEPENDENT_AMBULATORY_CARE_PROVIDER_SITE_OTHER): Payer: BC Managed Care – PPO | Admitting: Obstetrics & Gynecology

## 2015-10-25 ENCOUNTER — Encounter: Payer: Self-pay | Admitting: Obstetrics & Gynecology

## 2015-10-25 VITALS — BP 110/69 | HR 91 | Ht 69.0 in | Wt 182.0 lb

## 2015-10-25 DIAGNOSIS — F418 Other specified anxiety disorders: Secondary | ICD-10-CM | POA: Diagnosis not present

## 2015-10-25 DIAGNOSIS — F53 Postpartum depression: Secondary | ICD-10-CM

## 2015-10-25 DIAGNOSIS — O99345 Other mental disorders complicating the puerperium: Secondary | ICD-10-CM

## 2015-10-25 DIAGNOSIS — F329 Major depressive disorder, single episode, unspecified: Secondary | ICD-10-CM

## 2015-10-25 DIAGNOSIS — F419 Anxiety disorder, unspecified: Principal | ICD-10-CM

## 2015-10-25 MED ORDER — SERTRALINE HCL 100 MG PO TABS: 100.0000 mg | ORAL_TABLET | Freq: Every day | ORAL | Status: DC

## 2015-10-25 NOTE — Patient Instructions (Signed)
Return to clinic for any scheduled appointments or for any gynecologic concerns as needed.   

## 2015-10-25 NOTE — Progress Notes (Signed)
   CLINIC ENCOUNTER NOTE  History:  25 y.o. G1P1001 here today for follow up for postpartum depression and anxiety. Was started on Zoloft 50 mg three weeks ago, no change in symptoms. Cries all the same, and anxious about her baby all the time. No SI and HI.  Denies any abnormal vaginal discharge, bleeding, pelvic pain or other concerns.   Past Medical History  Diagnosis Date  . Medical history non-contributory   . Heart murmur   . Fourth degree perineal laceration during delivery 10/05/2015    Past Surgical History  Procedure Laterality Date  . Wisdom tooth extraction    . No past surgeries      The following portions of the patient's history were reviewed and updated as appropriate: allergies, current medications, past family history, past medical history, past social history, past surgical history and problem list.   Health Maintenance:  Normal pap in January 2016.  Review of Systems:  Pertinent items noted in HPI and remainder of comprehensive ROS otherwise negative.  Objective:  Physical Exam BP 110/69 mmHg  Pulse 91  Ht 5\' 9"  (1.753 m)  Wt 182 lb (82.555 kg)  BMI 26.86 kg/m2  LMP 10/11/2015  Breastfeeding? No CONSTITUTIONAL: Well-developed, well-nourished female in no acute distress.  HENT:  Normocephalic, atraumatic. External right and left ear normal. Oropharynx is clear and moist EYES: Conjunctivae and EOM are normal. Pupils are equal, round, and reactive to light. No scleral icterus.  NECK: Normal range of motion, supple, no masses SKIN: Skin is warm and dry. No rash noted. Not diaphoretic. No erythema. No pallor. NEUROLOGIC: Alert and oriented to person, place, and time. Normal reflexes, muscle tone coordination. No cranial nerve deficit noted. PSYCHIATRIC: Depressed mood and affect. Normal behavior. Normal judgment and thought content. CARDIOVASCULAR: Normal heart rate noted RESPIRATORY: Effort and breath sounds normal, no problems with respiration  noted ABDOMEN: Soft, no distention noted.   PELVIC: Deferred MUSCULOSKELETAL: Normal range of motion. No edema noted.  Labs and Imaging No results found.  Assessment & Plan:  1. Anxiety and depression 2. Postpartum depression Increased Zoloft dosage and referred to mental health provider - sertraline (ZOLOFT) 100 MG tablet; Take 1 tablet (100 mg total) by mouth daily.  Dispense: 30 tablet; Refill: 5 - Ambulatory referral to Psychology SI/HI precautions advised. Routine preventative health maintenance measures emphasized. Please refer to After Visit Summary for other counseling recommendations.    Total face-to-face time with patient: 15 minutes. Over 50% of encounter was spent on counseling and coordination of care.   Jaynie CollinsUGONNA  Janda Cargo, MD, FACOG Attending Obstetrician & Gynecologist, [facility]

## 2016-01-07 ENCOUNTER — Telehealth: Payer: Self-pay | Admitting: *Deleted

## 2016-01-07 DIAGNOSIS — F53 Postpartum depression: Secondary | ICD-10-CM

## 2016-01-07 DIAGNOSIS — F329 Major depressive disorder, single episode, unspecified: Secondary | ICD-10-CM

## 2016-01-07 DIAGNOSIS — F32A Depression, unspecified: Secondary | ICD-10-CM

## 2016-01-07 DIAGNOSIS — O99345 Other mental disorders complicating the puerperium: Secondary | ICD-10-CM

## 2016-01-07 DIAGNOSIS — F419 Anxiety disorder, unspecified: Principal | ICD-10-CM

## 2016-01-07 NOTE — Telephone Encounter (Signed)
Received message left on nurse voicemail on 01/07/16 at 0856.  [facility] pharmacy requesting 90 day supply on patient's sertroline 100 mg.  States insurance requires a 90 day supply.  Patient is seen at our [facility] office will forward to their nurse.

## 2016-01-10 MED ORDER — SERTRALINE HCL 100 MG PO TABS: 100.0000 mg | ORAL_TABLET | Freq: Every day | ORAL | 1 refills | Status: DC

## 2016-01-10 NOTE — Telephone Encounter (Signed)
Sent new rx for Zoloft 100mg  to be a 90 day supply per pharmacy request.

## 2016-03-20 ENCOUNTER — Telehealth: Payer: Self-pay | Admitting: *Deleted

## 2016-03-20 MED ORDER — NORETHINDRONE-ETH ESTRADIOL 1-35 MG-MCG PO TABS: 1.0000 | ORAL_TABLET | Freq: Every day | ORAL | 11 refills | Status: DC

## 2016-03-20 NOTE — Telephone Encounter (Signed)
Pt called in stating she has been having cycle every 3 weeks even taking pills as instructed. Sent different pill to pharmacy per Dr. Macon LargeAnyanwu. Pt will call back for a different form of contraception if break through bleeding continues.

## 2016-07-17 ENCOUNTER — Other Ambulatory Visit: Payer: Self-pay | Admitting: Obstetrics & Gynecology

## 2016-07-17 DIAGNOSIS — F419 Anxiety disorder, unspecified: Principal | ICD-10-CM

## 2016-07-17 DIAGNOSIS — F329 Major depressive disorder, single episode, unspecified: Secondary | ICD-10-CM

## 2016-07-17 DIAGNOSIS — O99345 Other mental disorders complicating the puerperium: Secondary | ICD-10-CM

## 2016-07-17 DIAGNOSIS — F53 Postpartum depression: Secondary | ICD-10-CM

## 2019-01-16 ENCOUNTER — Encounter: Payer: Self-pay | Admitting: Radiology

## 2019-02-10 ENCOUNTER — Ambulatory Visit (INDEPENDENT_AMBULATORY_CARE_PROVIDER_SITE_OTHER): Payer: BC Managed Care – PPO | Admitting: Obstetrics and Gynecology

## 2019-02-10 ENCOUNTER — Other Ambulatory Visit: Payer: Self-pay

## 2019-02-10 ENCOUNTER — Encounter: Payer: Self-pay | Admitting: Obstetrics and Gynecology

## 2019-02-10 VITALS — BP 111/77 | HR 72 | Wt 172.0 lb

## 2019-02-10 DIAGNOSIS — O09291 Supervision of pregnancy with other poor reproductive or obstetric history, first trimester: Secondary | ICD-10-CM | POA: Diagnosis not present

## 2019-02-10 DIAGNOSIS — Z3A10 10 weeks gestation of pregnancy: Secondary | ICD-10-CM

## 2019-02-10 DIAGNOSIS — Z1151 Encounter for screening for human papillomavirus (HPV): Secondary | ICD-10-CM

## 2019-02-10 DIAGNOSIS — Z113 Encounter for screening for infections with a predominantly sexual mode of transmission: Secondary | ICD-10-CM

## 2019-02-10 DIAGNOSIS — O0991 Supervision of high risk pregnancy, unspecified, first trimester: Secondary | ICD-10-CM

## 2019-02-10 DIAGNOSIS — Z124 Encounter for screening for malignant neoplasm of cervix: Secondary | ICD-10-CM

## 2019-02-10 DIAGNOSIS — O0990 Supervision of high risk pregnancy, unspecified, unspecified trimester: Secondary | ICD-10-CM

## 2019-02-10 DIAGNOSIS — Z3480 Encounter for supervision of other normal pregnancy, unspecified trimester: Secondary | ICD-10-CM

## 2019-02-10 NOTE — Progress Notes (Signed)
Baby scripts penicillin  allergy

## 2019-02-10 NOTE — Progress Notes (Signed)
Hip pain

## 2019-02-11 ENCOUNTER — Encounter: Payer: Self-pay | Admitting: Obstetrics and Gynecology

## 2019-02-11 LAB — COMPREHENSIVE METABOLIC PANEL
ALT: 10 IU/L (ref 0–32)
AST: 18 IU/L (ref 0–40)
Albumin/Globulin Ratio: 2.1 (ref 1.2–2.2)
Albumin: 4.9 g/dL (ref 3.9–5.0)
Alkaline Phosphatase: 45 IU/L (ref 39–117)
BUN/Creatinine Ratio: 13 (ref 9–23)
BUN: 8 mg/dL (ref 6–20)
Bilirubin Total: 0.4 mg/dL (ref 0.0–1.2)
CO2: 19 mmol/L — ABNORMAL LOW (ref 20–29)
Calcium: 9.2 mg/dL (ref 8.7–10.2)
Chloride: 102 mmol/L (ref 96–106)
Creatinine, Ser: 0.61 mg/dL (ref 0.57–1.00)
GFR calc Af Amer: 141 mL/min/{1.73_m2} (ref >59)
GFR calc non Af Amer: 122 mL/min/{1.73_m2} (ref >59)
Globulin, Total: 2.3 g/dL (ref 1.5–4.5)
Glucose: 87 mg/dL (ref 65–99)
Potassium: 3.9 mmol/L (ref 3.5–5.2)
Sodium: 136 mmol/L (ref 134–144)
Total Protein: 7.2 g/dL (ref 6.0–8.5)

## 2019-02-11 LAB — OBSTETRIC PANEL, INCLUDING HIV
Antibody Screen: NEGATIVE
Basophils Absolute: 0 10*3/uL (ref 0.0–0.2)
Basos: 0 %
EOS (ABSOLUTE): 0 10*3/uL (ref 0.0–0.4)
Eos: 0 %
HIV Screen 4th Generation wRfx: NONREACTIVE
Hematocrit: 38.7 % (ref 34.0–46.6)
Hemoglobin: 12.9 g/dL (ref 11.1–15.9)
Hepatitis B Surface Ag: NEGATIVE
Immature Grans (Abs): 0 10*3/uL (ref 0.0–0.1)
Immature Granulocytes: 0 %
Lymphocytes Absolute: 2.3 10*3/uL (ref 0.7–3.1)
Lymphs: 25 %
MCH: 29.7 pg (ref 26.6–33.0)
MCHC: 33.3 g/dL (ref 31.5–35.7)
MCV: 89 fL (ref 79–97)
Monocytes Absolute: 0.6 10*3/uL (ref 0.1–0.9)
Monocytes: 7 %
Neutrophils Absolute: 6.1 10*3/uL (ref 1.4–7.0)
Neutrophils: 68 %
Platelets: 279 10*3/uL (ref 150–450)
RBC: 4.34 x10E6/uL (ref 3.77–5.28)
RDW: 13 % (ref 11.7–15.4)
RPR Ser Ql: NONREACTIVE
Rh Factor: POSITIVE
Rubella Antibodies, IGG: 5.31 index (ref >0.99)
WBC: 9.1 10*3/uL (ref 3.4–10.8)

## 2019-02-11 LAB — HEMOGLOBIN A1C
Est. average glucose Bld gHb Est-mCnc: 97 mg/dL
Hgb A1c MFr Bld: 5 % (ref 4.8–5.6)

## 2019-02-11 LAB — PROTEIN / CREATININE RATIO, URINE
Creatinine, Urine: 23.1 mg/dL
Protein, Ur: <4 mg/dL

## 2019-02-11 NOTE — Progress Notes (Addendum)
New OB Note  02/10/2019   [facility]  Chief Complaint: NOB  Transfer of Care Patient: no  History of Present Illness: Ms. Kimberly Randolph is a 28 y.o. G2P1001 @ 10/0 weeks (EDC 3/29, based on  Patient's last menstrual period was 12/02/2018.=10wk u/s).  Preg complicated by has Fourth degree perineal laceration during delivery on their problem list.   Any events prior to today's visit: no Her periods were: qmonth, regular She has Negative signs or symptoms of nausea/vomiting of pregnancy. She has Negative signs or symptoms of miscarriage or preterm labor  ROS: A 12-point review of systems was performed and negative, except as stated in the above HPI.  OBGYN History: As per HPI. OB History  Gravida Para Term Preterm AB Living  2 1 1     1   SAB TAB Ectopic Multiple Live Births        0 1    # Outcome Date GA Lbr Len/2nd Weight Sex Delivery Anes PTL Lv  2 Current           1 Term 08/25/15 4415w2d 12:54 / 07:46 8 lb 9 oz (3.884 kg) M Vag-Vacuum EPI  LIV    Any issues with any prior pregnancies: VAVD for maternal exhaustion with "MLE" and 4th degree Prior children are healthy, doing well, and without any problems or issues: yes History of pap smears: Yes. Last pap smear unknown    Past Medical History: Past Medical History:  Diagnosis Date  . Fourth degree perineal laceration during delivery 10/05/2015  . Heart murmur     Past Surgical History: Past Surgical History:  Procedure Laterality Date  . WISDOM TOOTH EXTRACTION      Family History:  Family History  Problem Relation Age of Onset  . Cancer Mother 2345       Colon Cancer  . Cancer Father 5256       Bladder  . Diabetes Father        Type 2  . Hypertension Father   . Birth defects Sister infancy       Heart valve defect  . Diabetes Paternal Grandmother   . Cancer Paternal Grandfather        Pancreatic    Social History:  Social History   Socioeconomic History  . Marital status:  Married    Spouse name: Not on file  . Number of children: Not on file  . Years of education: Not on file  . Highest education level: Not on file  Occupational History  . Not on file  Social Needs  . Financial resource strain: Not on file  . Food insecurity    Worry: Not on file    Inability: Not on file  . Transportation needs    Medical: Not on file    Non-medical: Not on file  Tobacco Use  . Smoking status: Never Smoker  . Smokeless tobacco: Never Used  Substance and Sexual Activity  . Alcohol use: No  . Drug use: No  . Sexual activity: Not Currently    Birth control/protection: None  Lifestyle  . Physical activity    Days per week: Not on file    Minutes per session: Not on file  . Stress: Not on file  Relationships  . Social Musicianconnections    Talks on phone: Not on file    Gets together: Not on file    Attends religious service: Not on file    Active member of club or organization: Not  on file    Attends meetings of clubs or organizations: Not on file    Relationship status: Not on file  . Intimate partner violence    Fear of current or ex partner: Not on file    Emotionally abused: Not on file    Physically abused: Not on file    Forced sexual activity: Not on file  Other Topics Concern  . Not on file  Social History Narrative  . 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 Penicillins Rash    Has patient had a PCN reaction causing immediate rash, facial/tongue/throat swelling, SOB or lightheadedness with hypotension: unknown Has patient had a PCN reaction causing severe rash involving mucus membranes or skin necrosis: unknown Has patient had a PCN reaction that required hospitalization unknown Has patient had a PCN reaction occurring within the last 10 years: no If all of the above answers are "NO", then may proceed with Cephalosporin use.     Health Maintenance:  Mammogram Up to Date: not applicable  Current Outpatient  Medications: PNV  Physical Exam:   BP 111/77   Pulse 72   Wt 172 lb (78 kg)   LMP 12/02/2018   BMI 25.40 kg/m  Body mass index is 25.4 kg/m.    Marland Kitchen Fundal height: not applicable FHTs: 073X  General appearance: Well nourished, well developed female in no acute distress.  Neck:  Supple, normal appearance, and no thyromegaly  Cardiovascular: S1, S2 normal, no murmur, rub or gallop, regular rate and rhythm Respiratory:  Clear to auscultation bilateral. Normal respiratory effort Abdomen: positive bowel sounds and no masses, hernias; diffusely non tender to palpation, non distended Breasts: patient declines any breast s/s. Neuro/Psych:  Normal mood and affect.  Skin:  Warm and dry.  Lymphatic:  No inguinal lymphadenopathy.   Pelvic exam: is not limited by body habitus EGBUS: within normal limits, Vagina: within normal limits and with no blood in the vault, Cervix: normal appearing cervix without discharge or lesions, closed/long/high, Uterus:  enlarged, c/w 10 week size, and Adnexa:  normal adnexa and no mass, fullness, tenderness  Laboratory: None  Imaging:  Via bedside with SLIUP at 10/1wks, FHR 170s  Assessment: pt doing well   Plan: 1. Supervision of high risk pregnancy, antepartum Routine care. Declines carrier screen Patient is a Chief Technology Officer. D/w her re: COVID and if in person classes resume, she has the option to continue with virtual teaching. D/w her that if she feels more comfortable with this then can write her a note to continue with virtual teaching.  - Babyscripts Schedule Optimization - Genetic Screening - Obstetric Panel, Including HIV - Culture, OB Urine - Comprehensive metabolic panel - Protein / creatinine ratio, urine - Hemoglobin A1c - Cytology - PAP( [place]) - [place] MFM OB COMP + 14 WK; Future  2. H/o 4th degree Patient with no sequelae specifically fecal incontinence or issues with BMs. I told her that she had two risk factors for 4th with an OVD  and having to have an episotomy, with hopefully both of those being non recurrent issues. She also doesn't have any fecal incontinence issues. I told her there's a risk she could have a rpt 4th degree and/or new issues after this delivery but chances are low, but any patient that has had a 4th degree, I do offer them a primary c-section with subsequent pregnancies. I told her that at this point I wouldn't recommend one over the other and  that I would leave the decision up to her and will touch base with her in the early third trimester.   Problem list reviewed and updated.  Follow up in 2 weeks.  The nature of [facility] with multiple MDs and other Advanced Practice Providers was explained to patient; also emphasized that residents, students are part of our team.  >50% of 25 min visit spent on counseling and coordination of care.     Cornelia Copa MD Attending Center for [facility])

## 2019-02-12 LAB — CYTOLOGY - PAP
Chlamydia: NEGATIVE
Diagnosis: NEGATIVE
HPV: NOT DETECTED
Neisseria Gonorrhea: NEGATIVE
Trichomonas: NEGATIVE

## 2019-02-12 LAB — CULTURE, OB URINE

## 2019-02-12 LAB — URINE CULTURE, OB REFLEX

## 2019-02-13 ENCOUNTER — Encounter: Payer: Self-pay | Admitting: Radiology

## 2019-02-13 ENCOUNTER — Encounter: Payer: BC Managed Care – PPO | Admitting: Obstetrics and Gynecology

## 2019-02-13 LAB — VITAMIN D 25 HYDROXY (VIT D DEFICIENCY, FRACTURES): Vit D, 25-Hydroxy: 36.4 ng/mL (ref 30.0–100.0)

## 2019-02-13 LAB — SPECIMEN STATUS REPORT

## 2019-02-18 ENCOUNTER — Telehealth: Payer: Self-pay | Admitting: Radiology

## 2019-02-18 NOTE — Telephone Encounter (Signed)
Left message for patient to call [facility] for Panorama results  

## 2019-02-20 ENCOUNTER — Encounter: Payer: Self-pay | Admitting: Radiology

## 2019-02-20 ENCOUNTER — Telehealth: Payer: Self-pay | Admitting: Radiology

## 2019-02-20 NOTE — Telephone Encounter (Signed)
Patient informed of Panorama results  

## 2019-02-25 ENCOUNTER — Telehealth: Payer: Self-pay | Admitting: Radiology

## 2019-02-25 ENCOUNTER — Telehealth (INDEPENDENT_AMBULATORY_CARE_PROVIDER_SITE_OTHER): Payer: BC Managed Care – PPO | Admitting: Obstetrics and Gynecology

## 2019-02-25 ENCOUNTER — Other Ambulatory Visit: Payer: Self-pay

## 2019-02-25 VITALS — Wt 172.0 lb

## 2019-02-25 DIAGNOSIS — Z3A12 12 weeks gestation of pregnancy: Secondary | ICD-10-CM

## 2019-02-25 DIAGNOSIS — Z3481 Encounter for supervision of other normal pregnancy, first trimester: Secondary | ICD-10-CM

## 2019-02-25 DIAGNOSIS — Z3480 Encounter for supervision of other normal pregnancy, unspecified trimester: Secondary | ICD-10-CM

## 2019-02-25 MED ORDER — BLOOD PRESSURE KIT: PACK | 0 refills | Status: DC

## 2019-02-25 NOTE — Telephone Encounter (Signed)
Left message for patient to call [facility] to schedule 6 wk virtual visit. Babyscripts

## 2019-02-25 NOTE — Progress Notes (Signed)
    TELEHEALTH VIRTUAL OBSTETRICS VISIT ENCOUNTER NOTE  Clinic: [facility]  I connected with Kimberly Randolph on 02/25/19 at  9:00 AM EDT by telephone at home and verified that I am speaking with the correct person using two identifiers.   I discussed the limitations, risks, security and privacy concerns of performing an evaluation and management service by telephone and the availability of in person appointments. I also discussed with the patient that there may be a patient responsible charge related to this service. The patient expressed understanding and agreed to proceed.  Subjective:  Kimberly Randolph is a 28 y.o. G2P1001 at [redacted]w[redacted]d being followed for ongoing prenatal care.  She is currently monitored for the following issues for this low-risk pregnancy and has Supervision of other normal pregnancy, antepartum and Fourth degree perineal laceration during delivery on their problem list.  Patient reports no complaints. Reports fetal movement. Denies any contractions, bleeding or leaking of fluid.   The following portions of the patient's history were reviewed and updated as appropriate: allergies, current medications, past family history, past medical history, past social history, past surgical history and problem list.   Objective:   Vitals:   02/25/19 0919  Weight: 172 lb (78 kg)    Babyscripts Data Reviewed: yes  General:  Alert, oriented and cooperative.   Mental Status: Normal mood and affect perceived. Normal judgment and thought content.  Rest of physical exam deferred due to type of encounter  Assessment and Plan:  Pregnancy: G2P1001 at [redacted]w[redacted]d *Pregnancy: routine care. Anatomy u/s already scheduled. NOB, genetics normal. Weight stable. Try to troubleshoot bp cuff getting sent. Pt to log on to babyscripts and upload weights to make sure they are crossing over for [place] to check  Preterm labor symptoms and general obstetric precautions including but not limited to  vaginal bleeding, contractions, leaking of fluid and fetal movement were reviewed in detail with the patient.  I discussed the assessment and treatment plan with the patient. The patient was provided an opportunity to ask questions and all were answered. The patient agreed with the plan and demonstrated an understanding of the instructions. The patient was advised to call back or seek an in-person office evaluation/go to MAU at [facility] for any urgent or concerning symptoms. Please refer to After Visit Summary for other counseling recommendations.   I provided 5 minutes of non-face-to-face time during this encounter. The visit was conducted via [employer] Video-medicine  Return in about 6 weeks (around 04/08/2019) for low risk, virtual visit.  Future Appointments  Date Time Provider [place]  04/08/2019  9:00 AM WH-MFC [place] 3 WH-MFCUS MFC-US    Orlena Garmon, MD [facility], [place]

## 2019-02-25 NOTE — Progress Notes (Signed)
Patient has not received her blood pressure kit

## 2019-03-14 ENCOUNTER — Telehealth: Payer: Self-pay

## 2019-03-14 NOTE — Telephone Encounter (Signed)
I have reach out to [facility] and left a message for them to call us back with an update. [email or web address]

## 2019-03-14 NOTE — Telephone Encounter (Signed)
-----   Message from [name], NT sent at 03/13/2019 10:39 AM EDT ----- Kimberly Randolph has still not received her blood pressure cuff

## 2019-03-19 ENCOUNTER — Encounter: Payer: Self-pay | Admitting: *Deleted

## 2019-04-08 ENCOUNTER — Other Ambulatory Visit: Payer: Self-pay

## 2019-04-08 ENCOUNTER — Ambulatory Visit (HOSPITAL_COMMUNITY)
Admission: RE | Admit: 2019-04-08 | Discharge: 2019-04-08 | Disposition: A | Discharge status: Home / Self Care | Payer: BC Managed Care – PPO | Source: Ambulatory Visit | Attending: Obstetrics and Gynecology | Admitting: Obstetrics and Gynecology

## 2019-04-08 ENCOUNTER — Other Ambulatory Visit (HOSPITAL_COMMUNITY): Payer: Self-pay | Admitting: *Deleted

## 2019-04-08 DIAGNOSIS — Z363 Encounter for antenatal screening for malformations: Secondary | ICD-10-CM

## 2019-04-08 DIAGNOSIS — O0990 Supervision of high risk pregnancy, unspecified, unspecified trimester: Secondary | ICD-10-CM | POA: Diagnosis not present

## 2019-04-08 DIAGNOSIS — Z362 Encounter for other antenatal screening follow-up: Secondary | ICD-10-CM

## 2019-04-08 DIAGNOSIS — O359XX0 Maternal care for (suspected) fetal abnormality and damage, unspecified, not applicable or unspecified: Secondary | ICD-10-CM

## 2019-04-08 DIAGNOSIS — Z3A18 18 weeks gestation of pregnancy: Secondary | ICD-10-CM

## 2019-04-08 NOTE — Progress Notes (Signed)
I connected with  [name] on 04/09/19 at 10:45 AM EDT by telephone and verified that I am speaking with the correct person using two identifiers.   I discussed the limitations, risks, security and privacy concerns of performing an evaluation and management service by telephone and the availability of in person appointments. I also discussed with the patient that there may be a patient responsible charge related to this service. The patient expressed understanding and agreed to proceed.  Crosby Oyster, RN 04/09/2019  10:54 AM

## 2019-04-09 ENCOUNTER — Telehealth (INDEPENDENT_AMBULATORY_CARE_PROVIDER_SITE_OTHER): Payer: BC Managed Care – PPO | Admitting: Advanced Practice Midwife

## 2019-04-09 ENCOUNTER — Other Ambulatory Visit: Payer: Self-pay

## 2019-04-09 DIAGNOSIS — Z3480 Encounter for supervision of other normal pregnancy, unspecified trimester: Secondary | ICD-10-CM

## 2019-04-09 DIAGNOSIS — Z3A18 18 weeks gestation of pregnancy: Secondary | ICD-10-CM

## 2019-04-09 DIAGNOSIS — Z3482 Encounter for supervision of other normal pregnancy, second trimester: Secondary | ICD-10-CM

## 2019-04-09 NOTE — Progress Notes (Signed)
   TELEHEALTH VIRTUAL OBSTETRICS VISIT ENCOUNTER NOTE  I connected with Kimberly Randolph on 04/09/19 at 10:45 AM EDT by telephone at home and verified that I am speaking with the correct person using two identifiers.   I discussed the limitations, risks, security and privacy concerns of performing an evaluation and management service by telephone and the availability of in person appointments. I also discussed with the patient that there may be a patient responsible charge related to this service. The patient expressed understanding and agreed to proceed.  Subjective:  Kimberly Randolph is a 28 y.o. G2P1001 at [redacted]w[redacted]d being followed for ongoing prenatal care.  She is currently monitored for the following issues for this low-risk pregnancy and has Supervision of other normal pregnancy, antepartum and Fourth degree perineal laceration during delivery on their problem list.  Patient reports no complaints. Reports fetal movement. Denies any contractions, bleeding or leaking of fluid.   The following portions of the patient's history were reviewed and updated as appropriate: allergies, current medications, past family history, past medical history, past social history, past surgical history and problem list.   Objective:   General:  Alert, oriented and cooperative.   Mental Status: Normal mood and affect perceived. Normal judgment and thought content.  Rest of physical exam deferred due to type of encounter  Assessment and Plan:  Pregnancy: G2P1001 at [redacted]w[redacted]d 1. Supervision of other normal pregnancy, antepartum - Continue routine care - Anticipatory guidance about milestones and timing of remaining visits - Completion of fetal anatomy with MFM 05/05/19  Preterm labor symptoms and general obstetric precautions including but not limited to vaginal bleeding, contractions, leaking of fluid and fetal movement were reviewed in detail with the patient.  I discussed the assessment and treatment plan with the  patient. The patient was provided an opportunity to ask questions and all were answered. The patient agreed with the plan and demonstrated an understanding of the instructions. The patient was advised to call back or seek an in-person office evaluation/go to MAU at [facility] for any urgent or concerning symptoms. Please refer to After Visit Summary for other counseling recommendations.   I provided ten minutes of non-face-to-face time during this encounter. Visit was originally scheduled as Webex but patient was unable to achieve connectivity.  Return in about 6 weeks (at [redacted] weeks GA, around 05/21/2019) for Virtual.  Future Appointments  Date Time Provider [place]  05/05/2019  8:45 AM [place] MFC-US  05/05/2019  8:45 AM [place], [place] for [employer], [place]

## 2019-04-09 NOTE — Patient Instructions (Signed)

## 2019-05-05 ENCOUNTER — Ambulatory Visit (HOSPITAL_COMMUNITY)
Admission: RE | Admit: 2019-05-05 | Discharge: 2019-05-05 | Disposition: A | Discharge status: Home / Self Care | Payer: BC Managed Care – PPO | Source: Ambulatory Visit | Attending: Obstetrics and Gynecology | Admitting: Obstetrics and Gynecology

## 2019-05-05 ENCOUNTER — Other Ambulatory Visit: Payer: Self-pay

## 2019-05-05 ENCOUNTER — Other Ambulatory Visit (HOSPITAL_COMMUNITY): Payer: Self-pay | Admitting: *Deleted

## 2019-05-05 ENCOUNTER — Encounter (HOSPITAL_COMMUNITY): Payer: Self-pay

## 2019-05-05 ENCOUNTER — Ambulatory Visit (HOSPITAL_COMMUNITY): Payer: BC Managed Care – PPO | Admitting: *Deleted

## 2019-05-05 VITALS — BP 92/58 | HR 91 | Temp 97.8°F

## 2019-05-05 DIAGNOSIS — Z362 Encounter for other antenatal screening follow-up: Secondary | ICD-10-CM

## 2019-05-05 DIAGNOSIS — O0990 Supervision of high risk pregnancy, unspecified, unspecified trimester: Secondary | ICD-10-CM

## 2019-05-05 DIAGNOSIS — O35EXX0 Maternal care for other (suspected) fetal abnormality and damage, fetal genitourinary anomalies, not applicable or unspecified: Secondary | ICD-10-CM

## 2019-05-05 DIAGNOSIS — O358XX0 Maternal care for other (suspected) fetal abnormality and damage, not applicable or unspecified: Secondary | ICD-10-CM

## 2019-05-05 DIAGNOSIS — Z3A22 22 weeks gestation of pregnancy: Secondary | ICD-10-CM

## 2019-05-05 DIAGNOSIS — O283 Abnormal ultrasonic finding on antenatal screening of mother: Secondary | ICD-10-CM

## 2019-05-21 ENCOUNTER — Telehealth (INDEPENDENT_AMBULATORY_CARE_PROVIDER_SITE_OTHER): Payer: BC Managed Care – PPO | Admitting: Advanced Practice Midwife

## 2019-05-21 ENCOUNTER — Other Ambulatory Visit: Payer: Self-pay

## 2019-05-21 VITALS — BP 103/71 | Wt 185.0 lb

## 2019-05-21 DIAGNOSIS — O358XX0 Maternal care for other (suspected) fetal abnormality and damage, not applicable or unspecified: Secondary | ICD-10-CM

## 2019-05-21 DIAGNOSIS — Z3A24 24 weeks gestation of pregnancy: Secondary | ICD-10-CM

## 2019-05-21 DIAGNOSIS — Z3480 Encounter for supervision of other normal pregnancy, unspecified trimester: Secondary | ICD-10-CM

## 2019-05-21 DIAGNOSIS — O35EXX0 Maternal care for other (suspected) fetal abnormality and damage, fetal genitourinary anomalies, not applicable or unspecified: Secondary | ICD-10-CM

## 2019-05-21 NOTE — Progress Notes (Signed)
I connected with  [name] on 05/21/19 at  8:30 AM EST by telephone and verified that I am speaking with the correct person using two identifiers.   I discussed the limitations, risks, security and privacy concerns of performing an evaluation and management service by telephone and the availability of in person appointments. I also discussed with the patient that there may be a patient responsible charge related to this service. The patient expressed understanding and agreed to proceed.  Crosby Oyster, RN 05/21/2019  8:31 AM

## 2019-05-21 NOTE — Patient Instructions (Signed)
Glucose Tolerance Test During Pregnancy Why am I having this test? The glucose tolerance test (GTT) is done to check how your body processes sugar (glucose). This is one of several tests used to diagnose diabetes that develops during pregnancy (gestational diabetes mellitus). Gestational diabetes is a temporary form of diabetes that some women develop during pregnancy. It usually occurs during the second trimester of pregnancy and goes away after delivery. Testing (screening) for gestational diabetes usually occurs between 24 and 28 weeks of pregnancy. You may have the GTT test after having a 1-hour glucose screening test if the results from that test indicate that you may have gestational diabetes. You may also have this test if:  You have a history of gestational diabetes.  You have a history of giving birth to very large babies or have experienced repeated fetal loss (stillbirth).  You have signs and symptoms of diabetes, such as: ? Changes in your vision. ? Tingling or numbness in your hands or feet. ? Changes in hunger, thirst, and urination that are not otherwise explained by your pregnancy. What is being tested? This test measures the amount of glucose in your blood at different times during a period of 3 hours. This indicates how well your body is able to process glucose. What kind of sample is taken?  Blood samples are required for this test. They are usually collected by inserting a needle into a blood vessel. How do I prepare for this test?  For 3 days before your test, eat normally. Have plenty of carbohydrate-rich foods.  Follow instructions from your health care provider about: ? Eating or drinking restrictions on the day of the test. You may be asked to not eat or drink anything other than water (fast) starting 8-10 hours before the test. ? Changing or stopping your regular medicines. Some medicines may interfere with this test. Tell a health care provider about:  All  medicines you are taking, including vitamins, herbs, eye drops, creams, and over-the-counter medicines.  Any blood disorders you have.  Any surgeries you have had.  Any medical conditions you have. What happens during the test? First, your blood glucose will be measured. This is referred to as your fasting blood glucose, since you fasted before the test. Then, you will drink a glucose solution that contains a certain amount of glucose. Your blood glucose will be measured again 1, 2, and 3 hours after drinking the solution. This test takes about 3 hours to complete. You will need to stay at the testing location during this time. During the testing period:  Do not eat or drink anything other than the glucose solution.  Do not exercise.  Do not use any products that contain nicotine or tobacco, such as cigarettes and e-cigarettes. If you need help stopping, ask your health care provider. The testing procedure may vary among health care providers and hospitals. How are the results reported? Your results will be reported as milligrams of glucose per deciliter of blood (mg/dL) or millimoles per liter (mmol/L). Your health care provider will compare your results to normal ranges that were established after testing a large group of people (reference ranges). Reference ranges may vary among labs and hospitals. For this test, common reference ranges are:  Fasting: less than 95-105 mg/dL (5.3-5.8 mmol/L).  1 hour after drinking glucose: less than 180-190 mg/dL (10.0-10.5 mmol/L).  2 hours after drinking glucose: less than 155-165 mg/dL (8.6-9.2 mmol/L).  3 hours after drinking glucose: 140-145 mg/dL (7.8-8.1 mmol/L). What do the   results mean? Results within reference ranges are considered normal, meaning that your glucose levels are well-controlled. If two or more of your blood glucose levels are high, you may be diagnosed with gestational diabetes. If only one level is high, your health care  provider may suggest repeat testing or other tests to confirm a diagnosis. Talk with your health care provider about what your results mean. Questions to ask your health care provider Ask your health care provider, or the department that is doing the test:  When will my results be ready?  How will I get my results?  What are my treatment options?  What other tests do I need?  What are my next steps? Summary  The glucose tolerance test (GTT) is one of several tests used to diagnose diabetes that develops during pregnancy (gestational diabetes mellitus). Gestational diabetes is a temporary form of diabetes that some women develop during pregnancy.  You may have the GTT test after having a 1-hour glucose screening test if the results from that test indicate that you may have gestational diabetes. You may also have this test if you have any symptoms or risk factors for gestational diabetes.  Talk with your health care provider about what your results mean. This information is not intended to replace advice given to you by your health care provider. Make sure you discuss any questions you have with your health care provider. Document Released: 11/28/2011 Document Revised: 09/19/2018 Document Reviewed: 01/08/2017 Elsevier Patient Education  2020 Elsevier Inc.  

## 2019-05-21 NOTE — Progress Notes (Signed)
TELEHEALTH OBSTETRICS PRENATAL VIRTUAL VIDEO VISIT ENCOUNTER NOTE  [facility]   I connected with Kimberly JuniperKatie Hennes on 05/21/19 at  8:30 AM EST by MyChart Video Encounter at home and verified that I am speaking with the correct person using two identifiers.   I discussed the limitations, risks, security and privacy concerns of performing an evaluation and management service virtually and the availability of in person appointments. I also discussed with the patient that there may be a patient responsible charge related to this service. The patient expressed understanding and agreed to proceed. Subjective:  Kimberly Randolph is a 28 y.o. G2P1001 at 6756w2d being seen today for ongoing prenatal care.  She is currently monitored for the following issues for this low-risk pregnancy and has Supervision of other normal pregnancy, antepartum and Fourth degree perineal laceration during delivery on their problem list.  Patient reports no complaints.  Contractions: Not present. Vag. Bleeding: None.  Movement: Present. Denies any leaking of fluid.   The following portions of the patient's history were reviewed and updated as appropriate: allergies, current medications, past family history, past medical history, past social history, past surgical history and problem list.   Objective:   Vitals:   05/21/19 0830  BP: 103/71  Weight: 185 lb (83.9 kg)    Fetal Status:     Movement: Present     General:  Alert, oriented and cooperative. Patient is in no acute distress.  Respiratory: Normal respiratory effort, no problems with respiration noted  Mental Status: Normal mood and affect. Normal behavior. Normal judgment and thought content.  Rest of physical exam deferred due to type of encounter  Imaging: [place] Mfm Ob Follow Up  Result Date: 05/05/2019 ----------------------------------------------------------------------  OBSTETRICS REPORT                        (Signed Final 05/05/2019 10:21 am) ---------------------------------------------------------------------- Patient Info  ID #:       [id number]                          D.O.B.:  06/26/1990 (28 yrs)  Name:       Kimberly Randolph                     Visit Date: 05/05/2019 09:04 am ---------------------------------------------------------------------- Performed By  Performed By:     Lenise ArenaHannah Bazemore        Ref. Address:     [address]                    [address]                                                             [address]                                                             [place], [place]  [address]  Attending:        Ma Rings MD         [facility]  Referred By:      Cisne Bing MD ---------------------------------------------------------------------- Orders   #  Description                          Code         Ordered By   1  [place] MFM OB FOLLOW UP                  74081.44     Rosana Hoes  ----------------------------------------------------------------------   #  Order #                    Accession #                 Episode #   1  [id number]                  [id number]                  [id number]  ---------------------------------------------------------------------- Indications   Pyelectasis of fetus on prenatal ultrasound    O28.3   Encounter for antenatal screening for          Z36.3   malformations (low risk Panorama)   [redacted] weeks gestation of pregnancy                Z3A.22  ---------------------------------------------------------------------- Vital Signs  Weight (lb): 169                               Height:        5'9"  BMI:         24.95 ---------------------------------------------------------------------- Fetal Evaluation  Num Of Fetuses:         1  Fetal Heart Rate(bpm):  145  Cardiac Activity:       Observed  Presentation:            Cephalic  Placenta:               Posterior  P. Cord Insertion:      Previously Visualized  Amniotic Fluid  AFI FV:      Within normal limits                              Largest Pocket(cm)                              5.45 ---------------------------------------------------------------------- Biometry  BPD:      51.7  mm     G. Age:  21w 5d         34  %    CI:  70.99   %    70 - 86                                                          FL/HC:      20.2   %    18.4 - 20.2  HC:      195.5  mm     G. Age:  21w 5d         28  %    HC/AC:      1.11        1.06 - 1.25  AC:      176.7  mm     G. Age:  22w 4d         61  %    FL/BPD:     76.4   %    71 - 87  FL:       39.5  mm     G. Age:  22w 5d         65  %    FL/AC:      22.4   %    20 - 24  HUM:      35.4  mm     G. Age:  22w 2d         55  %  LV:        5.4  mm  Est. FW:     510  gm      1 lb 2 oz     70  % ---------------------------------------------------------------------- OB History  Gravidity:    2         Term:   1  Living:       1 ---------------------------------------------------------------------- Gestational Age  LMP:           22w 0d        Date:  12/02/18                 EDD:   09/08/19  U/S Today:     22w 1d                                        EDD:   09/07/19  Best:          22w 0d     Det. By:  LMP  (12/02/18)          EDD:   09/08/19 ---------------------------------------------------------------------- Anatomy  Cranium:               Appears normal         Aortic Arch:            Appears normal  Cavum:                 Appears normal         Ductal Arch:            Appears normal  Ventricles:            Appears normal         Diaphragm:              Previously seen  Choroid Plexus:        Previously  seen        Stomach:                Appears normal, left                                                                        sided  Cerebellum:            Previously seen        Abdomen:                Appears normal  Posterior Fossa:        Previously seen        Abdominal Wall:         Previously seen  Nuchal Fold:           Previously seen        Cord Vessels:           Previously seen  Face:                  Orbits and profile     Kidneys:                Right sided                         previously seen                                                                        pyelectasis,                                                                        4.79mm  Lips:                  Previously seen        Bladder:                Appears normal  Thoracic:              Appears normal         Spine:                  Previously seen  Heart:                 Previously seen        Upper Extremities:      Previously seen  RVOT:                  Previously seen        Lower Extremities:      Previously seen  LVOT:  Previously seen  Other:  Female gender Heels and 5th digit visualized. Nasal bone visualized. ---------------------------------------------------------------------- Cervix Uterus Adnexa  Cervix  Length:            5.3  cm.  Normal appearance by transabdominal scan. ---------------------------------------------------------------------- Comments  This patient was seen for a follow up exam as the fetal  cardiac views were unable to be fully visualized during her  prior exam.  She denies any problems since her last exam.  She was informed that the fetal growth and amniotic fluid  level appears appropriate for her gestational age.  The views of the fetal heart were visualized today.  There  were no obvious cardiac anomalies suspected.  The  limitations of ultrasound in the detection of all anomalies was  discussed.  Right pyelectasis measuring 0.4 to 0.5 cm was noted today.  The patient was advised that this is most likely a normal  variant and will likely resolve later in her pregnancy.  A follow-up exam was scheduled in 6 to 8 weeks for follow-up  of the right pyelectasis noted today.  ----------------------------------------------------------------------                   Johnell Comings, MD Electronically Signed Final Report   05/05/2019 10:21 am ----------------------------------------------------------------------   Assessment and Plan:  Pregnancy: G2P1001 at [redacted]w[redacted]d 1. Supervision of other normal pregnancy, antepartum - No complaints or concerns today - Praised for self-quarantining methods!  2. Encounter for repeat ultrasound of fetal pyelectasis, antepartum, single or unspecified fetus - S/p exam by Dr. Annamaria Boots 05/05/19 - Repeat imaging scheduled for 06/23/2019  Preterm labor symptoms and general obstetric precautions including but not limited to vaginal bleeding, contractions, leaking of fluid and fetal movement were reviewed in detail with the patient. I discussed the assessment and treatment plan with the patient. The patient was provided an opportunity to ask questions and all were answered. The patient agreed with the plan and demonstrated an understanding of the instructions. The patient was advised to call back or seek an in-person office evaluation/go to MAU at [facility] for any urgent or concerning symptoms. Please refer to After Visit Summary for other counseling recommendations.   I provided 10 minutes of face-to-face time during this encounter.  Return in about 4 weeks (around 06/18/2019) for In person GTT.  Future Appointments  Date Time Provider [place]  06/18/2019  9:00 AM Darlina Rumpf, CNM [facility]  06/23/2019  8:00 AM [place] MFC-US  06/23/2019  8:00 AM [place] 3 WH-MFCUS MFC-US    Mallie Snooks, MSN, CNM Certified Nurse Midwife, [employer] for [employer], [place] 05/21/19 9:41 AM

## 2019-06-13 NOTE — L&D Delivery Note (Addendum)
Delivery Note Pt pushed x 22 minutes. At 12:08 PM a viable and healthy female was delivered via Vaginal, Spontaneous (Presentation: Left Occiput Anterior).  APGAR: 8, 9; weight 8lb15.7oz.   Placenta status: Spontaneous, Intact.  Cord: 3 vessels with the following complications: None.  Cord pH: NA  Anesthesia: Epidural Episiotomy: None Lacerations: 2nd degree Suture Repair: 3.0 vicryl rapide Est. Blood Loss (mL): 267  Mom to postpartum.  Baby to Couplet care / Skin to Skin. Placenta to: BS Feeding: Breast Circ: NA Contraception: OP IUD  [place] 09/03/2019, 1:30 PM

## 2019-06-18 ENCOUNTER — Other Ambulatory Visit: Payer: Self-pay

## 2019-06-18 ENCOUNTER — Ambulatory Visit (INDEPENDENT_AMBULATORY_CARE_PROVIDER_SITE_OTHER): Payer: BC Managed Care – PPO | Admitting: Advanced Practice Midwife

## 2019-06-18 VITALS — BP 112/77 | HR 101 | Wt 200.4 lb

## 2019-06-18 DIAGNOSIS — Z23 Encounter for immunization: Secondary | ICD-10-CM

## 2019-06-18 DIAGNOSIS — O35EXX0 Maternal care for other (suspected) fetal abnormality and damage, fetal genitourinary anomalies, not applicable or unspecified: Secondary | ICD-10-CM

## 2019-06-18 DIAGNOSIS — Z3A28 28 weeks gestation of pregnancy: Secondary | ICD-10-CM

## 2019-06-18 DIAGNOSIS — O358XX0 Maternal care for other (suspected) fetal abnormality and damage, not applicable or unspecified: Secondary | ICD-10-CM

## 2019-06-18 DIAGNOSIS — Z3480 Encounter for supervision of other normal pregnancy, unspecified trimester: Secondary | ICD-10-CM

## 2019-06-18 DIAGNOSIS — Z8759 Personal history of other complications of pregnancy, childbirth and the puerperium: Secondary | ICD-10-CM

## 2019-06-18 NOTE — Progress Notes (Signed)
Box updated Tdap today

## 2019-06-18 NOTE — Patient Instructions (Signed)

## 2019-06-18 NOTE — Progress Notes (Signed)
   PRENATAL VISIT NOTE  Subjective:  Kimberly Randolph is a 29 y.o. G2P1001 at [redacted]w[redacted]d being seen today for ongoing prenatal care.  She is currently monitored for the following issues for this low-risk pregnancy and has Supervision of other normal pregnancy, antepartum and Fourth degree perineal laceration during delivery on their problem list.  Patient reports no complaints.  Contractions: Not present.  .  Movement: Present. Denies leaking of fluid.   The following portions of the patient's history were reviewed and updated as appropriate: allergies, current medications, past family history, past medical history, past social history, past surgical history and problem list. Problem list updated.  Objective:   Vitals:   06/18/19 0839  BP: 112/77  Pulse: (!) 101  Weight: 200 lb 6.4 oz (90.9 kg)    Fetal Status:   Fundal Height: 29 cm Movement: Present     General:  Alert, oriented and cooperative. Patient is in no acute distress.  Skin: Skin is warm and dry. No rash noted.   Cardiovascular: Normal heart rate noted  Respiratory: Normal respiratory effort, no problems with respiration noted  Abdomen: Soft, gravid, appropriate for gestational age.  Pain/Pressure: Absent     Pelvic: Cervical exam deferred        Extremities: Normal range of motion.  Edema: None  Mental Status: Normal mood and affect. Normal behavior. Normal judgment and thought content.   Assessment and Plan:  Pregnancy: G2P1001 at [redacted]w[redacted]d  1. Supervision of other normal pregnancy, antepartum - Continue routine care - TWG 35 lbs - 2 Hour GTT - RPR - CBC - HIV antibody (with reflex)  2. Encounter for repeat ultrasound of fetal pyelectasis, antepartum, single or unspecified fetus - Repeat imaging with MFM scheduled for 06/23/2019  3. Fourth degree perineal laceration during delivery - Previously offered primary cesarean vs labor. Currently undecided  Preterm labor symptoms and general obstetric precautions including but  not limited to vaginal bleeding, contractions, leaking of fluid and fetal movement were reviewed in detail with the patient. Please refer to After Visit Summary for other counseling recommendations.  Return in about 4 weeks (around 07/16/2019) for Virtual, MD to discuss VD vs primary cesarean.  Future Appointments  Date Time Provider Department Center  06/23/2019  8:00 AM [facility] NURSE [facility]  06/23/2019  8:00 AM [facility] [place] 3 [facility] M[facility]-US  07/15/2019  9:30 AM Reva Bores, MD [facility]    Calvert Cantor, CNM  `

## 2019-06-19 LAB — RPR: RPR Ser Ql: NONREACTIVE

## 2019-06-19 LAB — CBC
Hematocrit: 36.3 % (ref 34.0–46.6)
Hemoglobin: 12.3 g/dL (ref 11.1–15.9)
MCH: 30.8 pg (ref 26.6–33.0)
MCHC: 33.9 g/dL (ref 31.5–35.7)
MCV: 91 fL (ref 79–97)
Platelets: 217 10*3/uL (ref 150–450)
RBC: 4 x10E6/uL (ref 3.77–5.28)
RDW: 12.2 % (ref 11.7–15.4)
WBC: 10.4 10*3/uL (ref 3.4–10.8)

## 2019-06-19 LAB — GLUCOSE TOLERANCE, 2 HOURS W/ 1HR
Glucose, 1 hour: 114 mg/dL (ref 65–179)
Glucose, 2 hour: 94 mg/dL (ref 65–152)
Glucose, Fasting: 79 mg/dL (ref 65–91)

## 2019-06-19 LAB — HIV ANTIBODY (ROUTINE TESTING W REFLEX): HIV Screen 4th Generation wRfx: NONREACTIVE

## 2019-06-23 ENCOUNTER — Other Ambulatory Visit: Payer: Self-pay

## 2019-06-23 ENCOUNTER — Encounter (HOSPITAL_COMMUNITY): Payer: Self-pay

## 2019-06-23 ENCOUNTER — Ambulatory Visit (HOSPITAL_COMMUNITY): Payer: BC Managed Care – PPO | Admitting: *Deleted

## 2019-06-23 ENCOUNTER — Other Ambulatory Visit (HOSPITAL_COMMUNITY): Payer: Self-pay | Admitting: *Deleted

## 2019-06-23 ENCOUNTER — Ambulatory Visit (HOSPITAL_COMMUNITY)
Admission: RE | Admit: 2019-06-23 | Discharge: 2019-06-23 | Disposition: A | Discharge status: Home / Self Care | Payer: BC Managed Care – PPO | Source: Ambulatory Visit | Attending: Obstetrics and Gynecology | Admitting: Obstetrics and Gynecology

## 2019-06-23 VITALS — BP 111/61 | HR 101 | Temp 97.8°F

## 2019-06-23 DIAGNOSIS — Z3A29 29 weeks gestation of pregnancy: Secondary | ICD-10-CM | POA: Diagnosis not present

## 2019-06-23 DIAGNOSIS — O283 Abnormal ultrasonic finding on antenatal screening of mother: Secondary | ICD-10-CM | POA: Diagnosis not present

## 2019-06-23 DIAGNOSIS — O35EXX0 Maternal care for other (suspected) fetal abnormality and damage, fetal genitourinary anomalies, not applicable or unspecified: Secondary | ICD-10-CM

## 2019-06-23 DIAGNOSIS — O358XX0 Maternal care for other (suspected) fetal abnormality and damage, not applicable or unspecified: Secondary | ICD-10-CM

## 2019-07-01 ENCOUNTER — Encounter: Payer: Self-pay | Admitting: Obstetrics & Gynecology

## 2019-07-15 ENCOUNTER — Other Ambulatory Visit: Payer: Self-pay

## 2019-07-15 ENCOUNTER — Telehealth (INDEPENDENT_AMBULATORY_CARE_PROVIDER_SITE_OTHER): Payer: BC Managed Care – PPO | Admitting: Family Medicine

## 2019-07-15 VITALS — BP 98/79 | HR 100

## 2019-07-15 DIAGNOSIS — Z0289 Encounter for other administrative examinations: Secondary | ICD-10-CM

## 2019-07-15 DIAGNOSIS — Z3483 Encounter for supervision of other normal pregnancy, third trimester: Secondary | ICD-10-CM

## 2019-07-15 DIAGNOSIS — Z3A32 32 weeks gestation of pregnancy: Secondary | ICD-10-CM

## 2019-07-15 DIAGNOSIS — Z3480 Encounter for supervision of other normal pregnancy, unspecified trimester: Secondary | ICD-10-CM

## 2019-07-15 MED ORDER — MEDELA DOUBLE BREAST PUMP MISC: 1.0000 [IU] | 0 refills | Status: AC | PRN

## 2019-07-15 MED ORDER — MEDELA DOUBLE BREAST PUMP MISC: 1.0000 [IU] | 0 refills | Status: DC | PRN

## 2019-07-15 NOTE — Progress Notes (Signed)
TELEHEALTH OBSTETRICS PRENATAL VIRTUAL VIDEO VISIT ENCOUNTER NOTE  Provider location: [facility] [place] [facility]   I connected with Kimberly Randolph on 07/15/19 at  9:30 AM EST by MyChart Video Encounter at home and verified that I am speaking with the correct person using two identifiers.   I discussed the limitations, risks, security and privacy concerns of performing an evaluation and management service virtually and the availability of in person appointments. I also discussed with the patient that there may be a patient responsible charge related to this service. The patient expressed understanding and agreed to proceed. Subjective:  Kimberly Randolph is a 29 y.o. G2P1001 at [redacted]w[redacted]d being seen today for ongoing prenatal care.  She is currently monitored for the following issues for this low-risk pregnancy and has Supervision of other normal pregnancy, antepartum and Fourth degree perineal laceration during delivery on their problem list.  Patient reports no complaints.  Contractions: Not present.  .  Movement: Present. Denies any leaking of fluid.   The following portions of the patient's history were reviewed and updated as appropriate: allergies, current medications, past family history, past medical history, past social history, past surgical history and problem list.   Objective:   Vitals:   07/15/19 0927  BP: 98/79  Pulse: 100    Fetal Status:     Movement: Present     General:  Alert, oriented and cooperative. Patient is in no acute distress.  Respiratory: Normal respiratory effort, no problems with respiration noted  Mental Status: Normal mood and affect. Normal behavior. Normal judgment and thought content.  Rest of physical exam deferred due to type of encounter  Imaging: [place] MFM OB FOLLOW UP  Result Date: 06/23/2019 ----------------------------------------------------------------------  OBSTETRICS REPORT                       (Signed Final 06/23/2019 09:28 am)  ---------------------------------------------------------------------- Patient Info  ID #:       [id number]                          D.O.B.:  09/21/1990 (29 yrs)  Name:       Kimberly Randolph                     Visit Date: 06/23/2019 08:06 am ---------------------------------------------------------------------- Performed By  Performed By:     Georgie Chard        Ref. Address:     [address]                    [place], [place]  [address]  Attending:        Ma Rings MD         [facility]  Referred By:      [place] Bing MD ---------------------------------------------------------------------- Orders   #  Description                          Code         Ordered By   1  [place] MFM OB FOLLOW UP                  01749.44     Rosana Hoes  ----------------------------------------------------------------------   #  Order #                    Accession #                 Episode #   1  [id number]                  [id number]                  [id number]  ---------------------------------------------------------------------- Indications   Pyelectasis of fetus on prenatal ultrasound    O28.3   Encounter for antenatal screening for          Z36.3   malformations (low risk Panorama)   [redacted] weeks gestation of pregnancy                Z3A.29  ---------------------------------------------------------------------- Vital Signs                                                 Height:        5'9" ---------------------------------------------------------------------- Fetal Evaluation  Num Of Fetuses:         1  Fetal Heart Rate(bpm):  150  Cardiac Activity:       Observed  Presentation:           Cephalic  Placenta:               Posterior  P. Cord  Insertion:      Previously Visualized  Amniotic Fluid  AFI FV:      Within normal limits  AFI Sum(cm)     %Tile       Largest Pocket(cm)  21.53           87          6.33  RUQ(cm)       RLQ(cm)       LUQ(cm)        LLQ(cm)  5.05          5.04          6.33           5.11 ---------------------------------------------------------------------- Biometry  BPD:  75.2  mm     G. Age:  30w 1d         75  %    CI:        73.13   %    70 - 86                                                          FL/HC:      20.6   %    19.6 - 20.8  HC:      279.5  mm     G. Age:  30w 4d         66  %    HC/AC:      1.07        0.99 - 1.21  AC:      261.4  mm     G. Age:  30w 2d         80  %    FL/BPD:     76.7   %    71 - 87  FL:       57.7  mm     G. Age:  30w 1d         70  %    FL/AC:      22.1   %    20 - 24  HUM:      48.4  mm     G. Age:  28w 3d         32  %  LV:        3.5  mm  Est. FW:    1544  gm      3 lb 6 oz     83  % ---------------------------------------------------------------------- OB History  Gravidity:    2         Term:   1  Living:       1 ---------------------------------------------------------------------- Gestational Age  LMP:           29w 0d        Date:  12/02/18                 EDD:   09/08/19  U/S Today:     30w 2d                                        EDD:   08/30/19  Best:          29w 0d     Det. By:  LMP  (12/02/18)          EDD:   09/08/19 ---------------------------------------------------------------------- Anatomy  Cranium:               Appears normal         LVOT:                   Previously seen  Cavum:                 Appears normal         Aortic Arch:            Previously seen  Ventricles:  Appears normal         Ductal Arch:            Previously seen  Choroid Plexus:        Previously seen        Diaphragm:              Appears normal  Cerebellum:            Previously seen        Stomach:                Appears normal, left                                                                         sided  Posterior Fossa:       Previously seen        Abdomen:                Appears normal  Nuchal Fold:           Not applicable (>20    Abdominal Wall:         Previously seen                         wks GA)  Face:                  Orbits and profile     Cord Vessels:           Previously seen                         previously seen  Lips:                  Previously seen        Kidneys:                Appear normal  Palate:                Previously seen        Bladder:                Appears normal  Thoracic:              Appears normal         Spine:                  Previously seen  Heart:                 Appears normal         Upper Extremities:      Previously seen                         (4CH, axis, and                         situs)  RVOT:                  Previously seen        Lower Extremities:      Previously seen ---------------------------------------------------------------------- Comments  This patient was  seen for a follow up growth scan due to mild  pyelectasis that was noted during her prior ultrasound exams.  She denies any problems since  She was informed that the fetal growth and amniotic fluid  level appears appropriate for her gestational age.  Mild right pyelectasis measuring 0.5 to 0.6 cm continues to  be noted today.  The left fetal kidney appeared within normal  limits.  The patient was reassured that the mild right  pyelectasis noted on today's exam is most likely a normal  variant at her current gestational age.  A follow up exam was scheduled in 6 weeks to assess the  fetal kidneys closer to the time of delivery. ----------------------------------------------------------------------                   Ma Rings, MD Electronically Signed Final Report   06/23/2019 09:28 am ----------------------------------------------------------------------   Assessment and Plan:  Pregnancy: G2P1001 at [redacted]w[redacted]d 1. Supervision of other normal pregnancy, antepartum Continue  routine prenatal care.   2. Fourth degree perineal laceration during delivery Discussed at length--wants Vaginal delivery.  Preterm labor symptoms and general obstetric precautions including but not limited to vaginal bleeding, contractions, leaking of fluid and fetal movement were reviewed in detail with the patient. I discussed the assessment and treatment plan with the patient. The patient was provided an opportunity to ask questions and all were answered. The patient agreed with the plan and demonstrated an understanding of the instructions. The patient was advised to call back or seek an in-person office evaluation/go to MAU at [facility] for any urgent or concerning symptoms. Please refer to After Visit Summary for other counseling recommendations.   I provided 11 minutes of face-to-face time during this encounter.  Return in 2 weeks (on 07/29/2019) for virtual.  Future Appointments  Date Time Provider Department Center  07/29/2019  1:15 PM Tereso Newcomer, MD [facility]  08/04/2019 10:40 AM [facility] NURSE [facility]  08/04/2019 10:45 AM [facility] [place] 2 [facility]-MFCUS MFC-US  08/12/2019 10:00 AM [place] Bing, MD [facility]    Reva Bores, MD Center for [facility], [facility]

## 2019-07-15 NOTE — Progress Notes (Signed)
I connected with  Kimberly Randolph on 07/15/19 at  9:30 AM EST by telephone and verified that I am speaking with the correct person using two identifiers.   I discussed the limitations, risks, security and privacy concerns of performing an evaluation and management service by telephone and the availability of in person appointments. I also discussed with the patient that there may be a patient responsible charge related to this service. The patient expressed understanding and agreed to proceed.  Alinna Siple Emeline Darling, CMA 07/15/2019  9:31 AM

## 2019-07-15 NOTE — Patient Instructions (Signed)

## 2019-07-29 ENCOUNTER — Telehealth (INDEPENDENT_AMBULATORY_CARE_PROVIDER_SITE_OTHER): Payer: BC Managed Care – PPO | Admitting: Obstetrics & Gynecology

## 2019-07-29 ENCOUNTER — Other Ambulatory Visit: Payer: Self-pay

## 2019-07-29 ENCOUNTER — Encounter: Payer: Self-pay | Admitting: Obstetrics & Gynecology

## 2019-07-29 VITALS — BP 102/81 | HR 102 | Wt 201.0 lb

## 2019-07-29 DIAGNOSIS — Z3480 Encounter for supervision of other normal pregnancy, unspecified trimester: Secondary | ICD-10-CM

## 2019-07-29 DIAGNOSIS — Z3A34 34 weeks gestation of pregnancy: Secondary | ICD-10-CM

## 2019-07-29 DIAGNOSIS — O283 Abnormal ultrasonic finding on antenatal screening of mother: Secondary | ICD-10-CM | POA: Insufficient documentation

## 2019-07-29 NOTE — Patient Instructions (Addendum)
Return to office for any scheduled appointments. Call the office or go to the MAU at [facility] if:  You begin to have strong, frequent contractions  Your water breaks.  Sometimes it is a big gush of fluid, sometimes it is just a trickle that keeps getting your panties wet or running down your legs  You have vaginal bleeding.  It is normal to have a small amount of spotting if your cervix was checked.   You do not feel your baby moving like normal.  If you do not, get something to eat and drink and lay down and focus on feeling your baby move.   If your baby is still not moving like normal, you should call the office or go to MAU.  Any other obstetric concerns.    Intrauterine Device Information An intrauterine device (IUD) is a medical device that is inserted in the uterus to prevent pregnancy. It is a small, T-shaped device that has one or two nylon strings hanging down from it. The strings hang out of the lower part of the uterus (cervix) to allow for future IUD removal. There are two types of IUDs available:  Hormone IUD. This type of IUD is made of plastic and contains the hormone progestin (synthetic progesterone). A hormone IUD may last 5-7 years.  Copper IUD. This type of IUD has copper wire wrapped around it. A copper IUD may last up to 10 years. How is an IUD inserted? An IUD is inserted through the vagina and placed into the uterus with a minor medical procedure. The exact procedure for IUD insertion may vary among health care providers and hospitals. How does an IUD work? Synthetic progesterone in a hormonal IUD prevents pregnancy by:  Thickening cervical mucus to prevent sperm from entering the uterus.  Thinning the uterine lining to prevent a fertilized egg from being implanted there. Copper in a copper IUD prevents pregnancy by making the uterus and fallopian tubes produce a fluid that kills sperm. What are the advantages of an  IUD? Advantages of either type of IUD  It is highly effective in preventing pregnancy.  It is reversible. You can become pregnant shortly after the IUD is removed.  It is low-maintenance and can stay in place for a long time.  There are no estrogen-related side effects.  It can be used when breastfeeding.  It is not associated with weight gain.  It can be inserted right after childbirth, an abortion, or a miscarriage. Advantages of a hormone IUD  If it is inserted within 7 days of your period starting, it works right after it is inserted. If the hormone IUD is inserted at any other time in your cycle, you will need to use a backup method of birth control for 7 days after insertion.  It can make menstrual periods lighter.  It can reduce menstrual cramping.  It can be used for 3-5 years. Advantages of a copper IUD  It works right after it is inserted.  It can be used as a form of emergency birth control if it is inserted within 5 days after having unprotected sex.  It does not interfere with your body's natural hormones.  It can be used for 10 years. What are the disadvantages of an IUD?  An IUD may cause irregular menstrual bleeding for a period of time after insertion.  You may have pain during insertion and have cramping and vaginal bleeding after insertion.  An IUD may  cut the uterus (uterine perforation) when it is inserted. This is rare.  An IUD may cause pelvic inflammatory disease (PID), which is an infection in the uterus and fallopian tubes. This is rare, and it usually happens during the first 20 days after the IUD is inserted.  A copper IUD can make your menstrual flow heavier and more painful. How is an IUD removed?  You will lie on your back with your knees bent and your feet in footrests (stirrups).  A device will be inserted into your vagina to spread apart the vaginal walls (speculum). This will allow your health care provider to see the strings  attached to the IUD.  Your health care provider will use a small instrument (forceps) to grasp the IUD strings and pull firmly until the IUD is removed. You may have some discomfort when the IUD is removed. Your health care provider may recommend taking over-the-counter pain relievers, such as ibuprofen, before the procedure. You may also have minor spotting for a few days after the procedure. The exact procedure for IUD removal may vary among health care providers and hospitals. Is the IUD right for me? Your health care provider will make sure you are a good candidate for an IUD and will discuss the advantages, disadvantages, and possible side effects with you. Summary  An intrauterine device (IUD) is a medical device that is inserted in the uterus to prevent pregnancy. It is a small, T-shaped device that has one or two nylon strings hanging down from it.  A hormone IUD contains the hormone progestin (synthetic progesterone). A copper IUD has copper wire wrapped around it.  Synthetic progesterone in a hormone IUD prevents pregnancy by thickening cervical mucus and thinning the walls of the uterus. Copper in a copper IUD prevents pregnancy by making the uterus and fallopian tubes produce a fluid that kills sperm.  A hormone IUD can be left in place for 5-7 years. A copper IUD can be left in place for up to 10 years.  An IUD is inserted and removed by a health care provider. You may feel some pain during insertion and removal. Your health care provider may recommend taking over-the-counter pain medicine, such as ibuprofen, before an IUD procedure. This information is not intended to replace advice given to you by your health care provider. Make sure you discuss any questions you have with your health care provider. Document Revised: 05/11/2017 Document Reviewed: 06/27/2016 Elsevier Patient Education  [place].

## 2019-07-29 NOTE — Progress Notes (Signed)
TELEHEALTH OBSTETRICS PRENATAL VIRTUAL VIDEO VISIT ENCOUNTER NOTE  Provider location: Center for [facility]   I connected with Kimberly Randolph on 07/29/19 at  1:15 PM EST by MyChart Video Encounter at home and verified that I am speaking with the correct person using two identifiers.   I discussed the limitations, risks, security and privacy concerns of performing an evaluation and management service virtually and the availability of in person appointments. I also discussed with the patient that there may be a patient responsible charge related to this service. The patient expressed understanding and agreed to proceed. Subjective:  Kimberly Randolph is a 29 y.o. G2P1001 at [redacted]w[redacted]d being seen today for ongoing prenatal care.  She is currently monitored for the following issues for this low-risk pregnancy and has Supervision of other normal pregnancy, antepartum; Fourth degree perineal laceration during delivery; and Pyelectasis of fetus on prenatal ultrasound on their problem list.  Patient reports no complaints.  Contractions: Not present. Vag. Bleeding: None.  Movement: Present. Denies any leaking of fluid.   The following portions of the patient's history were reviewed and updated as appropriate: allergies, current medications, past family history, past medical history, past social history, past surgical history and problem list.   Objective:   Vitals:   07/29/19 1308  BP: 102/81  Pulse: (!) 102  Weight: 201 lb (91.2 kg)    Fetal Status:     Movement: Present     General:  Alert, oriented and cooperative. Patient is in no acute distress.  Respiratory: Normal respiratory effort, no problems with respiration noted  Mental Status: Normal mood and affect. Normal behavior. Normal judgment and thought content.  Rest of physical exam deferred due to type of encounter  Imaging: [place] MFM OB FOLLOW UP  Result Date:  06/23/2019 ----------------------------------------------------------------------  OBSTETRICS REPORT                       (Signed Final 06/23/2019 09:28 am) ---------------------------------------------------------------------- Patient Info  ID #:       [id number]                          D.O.B.:  1990/07/20 (29 yrs)  Name:       Kimberly Randolph                     Visit Date: 06/23/2019 08:06 am ---------------------------------------------------------------------- Performed By  Performed By:     Sandi Mealy        Ref. Address:     [address]                                                             [place], [place]  [address]  Attending:        Ma Rings MD         [facility]  Referred By:      [place] Bing MD ---------------------------------------------------------------------- Orders   #  Description                          Code         Ordered By   1  [place] MFM OB FOLLOW UP                  93790.24     Rosana Hoes  ----------------------------------------------------------------------   #  Order #                    Accession #                 Episode #   1  [id number]                  [id number]                  [id number]  ---------------------------------------------------------------------- Indications   Pyelectasis of fetus on prenatal ultrasound    O28.3   Encounter for antenatal screening for          Z36.3   malformations (low risk Panorama)   [redacted] weeks gestation of pregnancy                Z3A.29  ---------------------------------------------------------------------- Vital Signs                                                 Height:        5'9" ---------------------------------------------------------------------- Fetal Evaluation  Num Of  Fetuses:         1  Fetal Heart Rate(bpm):  150  Cardiac Activity:       Observed  Presentation:           Cephalic  Placenta:               Posterior  P. Cord Insertion:      Previously Visualized  Amniotic Fluid  AFI FV:      Within normal limits  AFI Sum(cm)     %Tile       Largest Pocket(cm)  21.53           87          6.33  RUQ(cm)       RLQ(cm)       LUQ(cm)        LLQ(cm)  5.05          5.04          6.33           5.11 ---------------------------------------------------------------------- Biometry  BPD:  75.2  mm     G. Age:  30w 1d         75  %    CI:        73.13   %    70 - 86                                                          FL/HC:      20.6   %    19.6 - 20.8  HC:      279.5  mm     G. Age:  30w 4d         66  %    HC/AC:      1.07        0.99 - 1.21  AC:      261.4  mm     G. Age:  30w 2d         80  %    FL/BPD:     76.7   %    71 - 87  FL:       57.7  mm     G. Age:  30w 1d         70  %    FL/AC:      22.1   %    20 - 24  HUM:      48.4  mm     G. Age:  28w 3d         32  %  LV:        3.5  mm  Est. FW:    1544  gm      3 lb 6 oz     83  % ---------------------------------------------------------------------- OB History  Gravidity:    2         Term:   1  Living:       1 ---------------------------------------------------------------------- Gestational Age  LMP:           29w 0d        Date:  12/02/18                 EDD:   09/08/19  U/S Today:     30w 2d                                        EDD:   08/30/19  Best:          29w 0d     Det. By:  LMP  (12/02/18)          EDD:   09/08/19 ---------------------------------------------------------------------- Anatomy  Cranium:               Appears normal         LVOT:                   Previously seen  Cavum:                 Appears normal         Aortic Arch:            Previously seen  Ventricles:  Appears normal         Ductal Arch:            Previously seen  Choroid Plexus:        Previously seen        Diaphragm:               Appears normal  Cerebellum:            Previously seen        Stomach:                Appears normal, left                                                                        sided  Posterior Fossa:       Previously seen        Abdomen:                Appears normal  Nuchal Fold:           Not applicable (>20    Abdominal Wall:         Previously seen                         wks GA)  Face:                  Orbits and profile     Cord Vessels:           Previously seen                         previously seen  Lips:                  Previously seen        Kidneys:                Appear normal  Palate:                Previously seen        Bladder:                Appears normal  Thoracic:              Appears normal         Spine:                  Previously seen  Heart:                 Appears normal         Upper Extremities:      Previously seen                         (4CH, axis, and                         situs)  RVOT:                  Previously seen        Lower Extremities:      Previously seen ---------------------------------------------------------------------- Comments  This patient was  seen for a follow up growth scan due to mild  pyelectasis that was noted during her prior ultrasound exams.  She denies any problems since  She was informed that the fetal growth and amniotic fluid  level appears appropriate for her gestational age.  Mild right pyelectasis measuring 0.5 to 0.6 cm continues to  be noted today.  The left fetal kidney appeared within normal  limits.  The patient was reassured that the mild right  pyelectasis noted on today's exam is most likely a normal  variant at her current gestational age.  A follow up exam was scheduled in 6 weeks to assess the  fetal kidneys closer to the time of delivery. ----------------------------------------------------------------------                   Ma Rings, MD Electronically Signed Final Report   06/23/2019 09:28 am  ----------------------------------------------------------------------  [place] MFM OB FOLLOW UP  Result Date: 05/05/2019 ----------------------------------------------------------------------  OBSTETRICS REPORT                       (Signed Final 05/05/2019 10:21 am) ---------------------------------------------------------------------- Patient Info  ID #:       [id number]                          D.O.B.:  1990-11-25 (28 yrs)  Name:       MADDALYNN BARNARD                     Visit Date: 05/05/2019 09:04 am ---------------------------------------------------------------------- Performed By  Performed By:     Lenise Arena        Ref. Address:     [address]                                                             [place], [place]                                                             [address]  Attending:        Ma Rings MD         [facility]  Referred By:  [place] Bing MD ---------------------------------------------------------------------- Orders   #  Description                          Code         Ordered By   1  [place] MFM OB FOLLOW UP                  [phone]     YU FANG  ----------------------------------------------------------------------   #  Order #                    Accession #                 Episode #   1  [id number]                  [id number]                  [id number]  ---------------------------------------------------------------------- Indications   Pyelectasis of fetus on prenatal ultrasound    O28.3   Encounter for antenatal screening for          Z36.3   malformations (low risk Panorama)   [redacted] weeks gestation of pregnancy                Z3A.22  ---------------------------------------------------------------------- Vital Signs  Weight (lb): 169                               Height:         5'9"  BMI:         24.95 ---------------------------------------------------------------------- Fetal Evaluation  Num Of Fetuses:         1  Fetal Heart Rate(bpm):  145  Cardiac Activity:       Observed  Presentation:           Cephalic  Placenta:               Posterior  P. Cord Insertion:      Previously Visualized  Amniotic Fluid  AFI FV:      Within normal limits                              Largest Pocket(cm)                              5.45 ---------------------------------------------------------------------- Biometry  BPD:      51.7  mm     G. Age:  21w 5d         34  %    CI:        70.99   %    70 - 86                                                          FL/HC:      20.2   %    18.4 - 20.2  HC:      195.5  mm     G. Age:  21w 5d  28  %    HC/AC:      1.11        1.06 - 1.25  AC:      176.7  mm     G. Age:  22w 4d         61  %    FL/BPD:     76.4   %    71 - 87  FL:       39.5  mm     G. Age:  22w 5d         65  %    FL/AC:      22.4   %    20 - 24  HUM:      35.4  mm     G. Age:  22w 2d         55  %  LV:        5.4  mm  Est. FW:     510  gm      1 lb 2 oz     70  % ---------------------------------------------------------------------- OB History  Gravidity:    2         Term:   1  Living:       1 ---------------------------------------------------------------------- Gestational Age  LMP:           22w 0d        Date:  12/02/18                 EDD:   09/08/19  U/S Today:     22w 1d                                        EDD:   09/07/19  Best:          22w 0d     Det. By:  LMP  (12/02/18)          EDD:   09/08/19 ---------------------------------------------------------------------- Anatomy  Cranium:               Appears normal         Aortic Arch:            Appears normal  Cavum:                 Appears normal         Ductal Arch:            Appears normal  Ventricles:            Appears normal         Diaphragm:              Previously seen  Choroid Plexus:        Previously seen         Stomach:                Appears normal, left                                                                        sided  Cerebellum:  Previously seen        Abdomen:                Appears normal  Posterior Fossa:       Previously seen        Abdominal Wall:         Previously seen  Nuchal Fold:           Previously seen        Cord Vessels:           Previously seen  Face:                  Orbits and profile     Kidneys:                Right sided                         previously seen                                                                        pyelectasis,                                                                        4.52mm  Lips:                  Previously seen        Bladder:                Appears normal  Thoracic:              Appears normal         Spine:                  Previously seen  Heart:                 Previously seen        Upper Extremities:      Previously seen  RVOT:                  Previously seen        Lower Extremities:      Previously seen  LVOT:                  Previously seen  Other:  Female gender Heels and 5th digit visualized. Nasal bone visualized. ---------------------------------------------------------------------- Cervix Uterus Adnexa  Cervix  Length:            5.3  cm.  Normal appearance by transabdominal scan. ---------------------------------------------------------------------- Comments  This patient was seen for a follow up exam as the fetal  cardiac views were unable to be fully visualized during her  prior exam.  She denies any problems since her last exam.  She was informed that the fetal growth and amniotic fluid  level appears appropriate for her gestational age.  The views of the fetal heart were visualized today.  There  were no obvious cardiac anomalies suspected.  The  limitations of ultrasound in the detection of all anomalies was  discussed.  Right pyelectasis measuring 0.4 to 0.5 cm was noted today.  The patient was advised  that this is most likely a normal  variant and will likely resolve later in her pregnancy.  A follow-up exam was scheduled in 6 to 8 weeks for follow-up  of the right pyelectasis noted today. ----------------------------------------------------------------------                   Ma RingsVictor Fang, MD Electronically Signed Final Report   05/05/2019 10:21 am ----------------------------------------------------------------------    Assessment and Plan:  Pregnancy: G2P1001 at 5937w1d 1. Pyelectasis of fetus on prenatal ultrasound Mild right pyelectasis noted on last scan, next scan on 08/04/19. Will follow up results  2. Supervision of other normal pregnancy, antepartum Discussed postpartum contraception options, leaning towards IUD. Discussed R/B/I, information given to her to review.  Pelvic cultures to be done next visit - Strep Gp B Culture+Rflx; Future - GC/Chlamydia probe amp ([place])not at [facility]; Future  Preterm labor symptoms and general obstetric precautions including but not limited to vaginal bleeding, contractions, leaking of fluid and fetal movement were reviewed in detail with the patient. I discussed the assessment and treatment plan with the patient. The patient was provided an opportunity to ask questions and all were answered. The patient agreed with the plan and demonstrated an understanding of the instructions. The patient was advised to call back or seek an in-person office evaluation/[facility] for any urgent or concerning symptoms. Please refer to After Visit Summary for other counseling recommendations.   I provided 10 minutes of face-to-face time during this encounter.  Return in about 2 weeks (around 08/12/2019) for OFFICE OB Visit, Pelvic cultures.  Future Appointments  Date Time Provider Department Center  08/04/2019 10:40 AM [facility] NURSE [facility]  08/04/2019 10:45 AM [facility] US 2 [facility] M[facility]-US  08/12/2019 10:00 AM [place] BingPickens, Charlie, MD  [facility]    Jaynie CollinsUgonna Norvella Loscalzo, MD [facility]

## 2019-08-04 ENCOUNTER — Ambulatory Visit (HOSPITAL_COMMUNITY)
Admission: RE | Admit: 2019-08-04 | Discharge: 2019-08-04 | Disposition: A | Discharge status: Home / Self Care | Payer: BC Managed Care – PPO | Source: Ambulatory Visit | Attending: Obstetrics and Gynecology | Admitting: Obstetrics and Gynecology

## 2019-08-04 ENCOUNTER — Other Ambulatory Visit: Payer: Self-pay

## 2019-08-04 ENCOUNTER — Other Ambulatory Visit (HOSPITAL_COMMUNITY): Payer: Self-pay | Admitting: *Deleted

## 2019-08-04 ENCOUNTER — Encounter (HOSPITAL_COMMUNITY): Payer: Self-pay

## 2019-08-04 ENCOUNTER — Ambulatory Visit (HOSPITAL_COMMUNITY): Payer: BC Managed Care – PPO | Admitting: *Deleted

## 2019-08-04 VITALS — BP 102/56 | HR 80 | Temp 97.8°F

## 2019-08-04 DIAGNOSIS — O35EXX0 Maternal care for other (suspected) fetal abnormality and damage, fetal genitourinary anomalies, not applicable or unspecified: Secondary | ICD-10-CM

## 2019-08-04 DIAGNOSIS — O3660X0 Maternal care for excessive fetal growth, unspecified trimester, not applicable or unspecified: Secondary | ICD-10-CM

## 2019-08-04 DIAGNOSIS — O283 Abnormal ultrasonic finding on antenatal screening of mother: Secondary | ICD-10-CM | POA: Diagnosis not present

## 2019-08-04 DIAGNOSIS — Z3A35 35 weeks gestation of pregnancy: Secondary | ICD-10-CM

## 2019-08-04 DIAGNOSIS — Z362 Encounter for other antenatal screening follow-up: Secondary | ICD-10-CM | POA: Diagnosis not present

## 2019-08-04 DIAGNOSIS — O358XX0 Maternal care for other (suspected) fetal abnormality and damage, not applicable or unspecified: Secondary | ICD-10-CM

## 2019-08-12 ENCOUNTER — Telehealth (INDEPENDENT_AMBULATORY_CARE_PROVIDER_SITE_OTHER): Payer: BC Managed Care – PPO | Admitting: Obstetrics and Gynecology

## 2019-08-12 ENCOUNTER — Other Ambulatory Visit: Payer: Self-pay

## 2019-08-12 ENCOUNTER — Telehealth: Payer: Self-pay | Admitting: Obstetrics and Gynecology

## 2019-08-12 VITALS — BP 113/83

## 2019-08-12 DIAGNOSIS — O3663X0 Maternal care for excessive fetal growth, third trimester, not applicable or unspecified: Secondary | ICD-10-CM | POA: Insufficient documentation

## 2019-08-12 DIAGNOSIS — Z3A36 36 weeks gestation of pregnancy: Secondary | ICD-10-CM

## 2019-08-12 DIAGNOSIS — F32A Depression, unspecified: Secondary | ICD-10-CM

## 2019-08-12 DIAGNOSIS — Z9889 Other specified postprocedural states: Secondary | ICD-10-CM

## 2019-08-12 DIAGNOSIS — F329 Major depressive disorder, single episode, unspecified: Secondary | ICD-10-CM

## 2019-08-12 DIAGNOSIS — O283 Abnormal ultrasonic finding on antenatal screening of mother: Secondary | ICD-10-CM

## 2019-08-12 DIAGNOSIS — Z3480 Encounter for supervision of other normal pregnancy, unspecified trimester: Secondary | ICD-10-CM

## 2019-08-12 DIAGNOSIS — O99343 Other mental disorders complicating pregnancy, third trimester: Secondary | ICD-10-CM

## 2019-08-12 DIAGNOSIS — Z8759 Personal history of other complications of pregnancy, childbirth and the puerperium: Secondary | ICD-10-CM | POA: Insufficient documentation

## 2019-08-12 HISTORY — DX: Depression, unspecified: F32.A

## 2019-08-12 HISTORY — DX: Personal history of other complications of pregnancy, childbirth and the puerperium: Z87.59

## 2019-08-12 HISTORY — DX: Major depressive disorder, single episode, unspecified: F32.9

## 2019-08-12 HISTORY — DX: Maternal care for excessive fetal growth, third trimester, not applicable or unspecified: O36.63X0

## 2019-08-12 HISTORY — DX: Other mental disorders complicating pregnancy, third trimester: O99.343

## 2019-08-12 NOTE — Progress Notes (Signed)
TELEHEALTH VIRTUAL OBSTETRICS VISIT ENCOUNTER NOTE  Clinic: [facility]  I connected with Kimberly Randolph on 08/12/19 at 10:00 AM EST by telephone at home and verified that I am speaking with the correct person using two identifiers.   I discussed the limitations, risks, security and privacy concerns of performing an evaluation and management service by telephone and the availability of in person appointments. I also discussed with the patient that there may be a patient responsible charge related to this service. The patient expressed understanding and agreed to proceed.  Subjective:  Kimberly Randolph is a 28 y.o. G2P1001 at [redacted]w[redacted]d being followed for ongoing prenatal care.  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  Patient reports depressive s/s (see below). Reports fetal movement. Denies any contractions, bleeding or leaking of fluid.   The following portions of the patient's history were reviewed and updated as appropriate: allergies, current medications, past family history, past medical history, past social history, past surgical history and problem list.   Objective:   Vitals:   08/12/19 0955  BP: 113/83    Babyscripts Data Reviewed: yes  General:  Alert, oriented and cooperative.   Mental Status: Normal mood and affect perceived. Normal judgment and thought content.  Rest of physical exam deferred due to type of encounter  Assessment and Plan:  Pregnancy: G2P1001 at [redacted]w[redacted]d 1. Supervision of other normal pregnancy, antepartum Routine care. GBS nv  2. Excessive fetal growth affecting management  of pregnancy in third trimester, single or unspecified fetus LGA at 3196gm, 97% and large AC, normal AFI dx at last growth u/s on 2/22; normal 2h GTT. Has rpt u/s in two weeks  She has a h/o a 4th degree with her 2017 delivery (3884gm, VAVD with episotomy). She states she has no sequelae from it. I told her that an OVD with an episotomy were definite risk factors in the past, in addition for being almost macrosomic. I told her that late third trimester u/s isn't the best with weight prediction and the patient isn't sure if this baby feels bigger or smaller than the last one.  I told her that having an LGA fetus is a risk factor and that anyone with a h/o a 4th I offer a c-section. At the very least, I told her to strongly consider a 39wk IOL given she's a multip and likely no increase in having a failed IOL.   Pt to consider re: delivery mode and can talk with her more next visit.   3. Pyelectasis of fetus on prenatal ultrasound Recommend post natal f/u  4. History of episiotomy See above  5. 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 I told her I think it's reasonable to start her maternity leave now and to have her see Marijean Niemann at the main clinic. Pt is amenable to plan - Ambulatory referral to [facility]  Preterm labor symptoms and general obstetric precautions including but not limited to vaginal bleeding, contractions, leaking of fluid and fetal movement were reviewed in detail with the patient.  I discussed the assessment and treatment plan with the patient. The  patient was provided an opportunity to ask questions and all were answered. The patient agreed with the plan and demonstrated an understanding of the instructions. The patient was advised to call back or seek an in-person office evaluation/go to MAU at [facility] for any urgent or concerning  symptoms. Please refer to After Visit Summary for other counseling recommendations.   I provided 15 minutes of non-face-to-face time during this encounter. The visit was conducted via MyChart-medicine  No follow-ups on file.  Future Appointments  Date Time Provider [place]  08/18/2019  3:30 PM Aletha Halim, MD [facility]  08/25/2019  9:45 AM [facility] NURSE [place] [facility]  08/25/2019  9:45 AM [place] 5 [facility] MFC-US    Aletha Halim, MD Center for [facility], [place]

## 2019-08-12 NOTE — Telephone Encounter (Signed)
Attempted to reach patient to schedule an appointment. Left a message for her to call the office.

## 2019-08-12 NOTE — Progress Notes (Signed)
I connected with  Kimberly Randolph on 08/12/19 at 10:00 AM EST by telephone and verified that I am speaking with the correct person using two identifiers.   I discussed the limitations, risks, security and privacy concerns of performing an evaluation and management service by telephone and the availability of in person appointments. I also discussed with the patient that there may be a patient responsible charge related to this service. The patient expressed understanding and agreed to proceed.  Scheryl Marten, RN 08/12/2019  9:56 AM

## 2019-08-13 ENCOUNTER — Ambulatory Visit (INDEPENDENT_AMBULATORY_CARE_PROVIDER_SITE_OTHER): Payer: BC Managed Care – PPO | Admitting: Clinical

## 2019-08-13 ENCOUNTER — Other Ambulatory Visit: Payer: Self-pay

## 2019-08-13 DIAGNOSIS — F4323 Adjustment disorder with mixed anxiety and depressed mood: Secondary | ICD-10-CM

## 2019-08-13 NOTE — BH Specialist Note (Signed)
Integrated Behavioral Health via Telemedicine Video Visit  08/13/2019 Kimberly Randolph [id number]  Number of [place] visits: 1 Session Start time: 3:21  Session End time: 4:08 Total time: 42  Referring Provider: Aletha Halim, MD Type of Visit: Video Patient/Family location: Home [facility] Provider location: [facility] All persons participating in visit: [name] and Kimberly Randolph    Confirmed patient's address: Yes  Confirmed patient's phone number: Yes  Any changes to demographics: No   Confirmed patient's insurance: Yes  Any changes to patient's insurance: No   Discussed confidentiality: Yes   I connected with Kimberly Randolph by a video enabled telemedicine application and verified that I am speaking with the correct person using two identifiers.     I discussed the limitations of evaluation and management by telemedicine and the availability of in person appointments.  I discussed that the purpose of this visit is to provide behavioral health care while limiting exposure to the novel coronavirus.   Discussed there is a possibility of technology failure and discussed alternative modes of communication if that failure occurs.  I discussed that engaging in this video visit, they consent to the provision of behavioral healthcare and the services will be billed under their insurance.  Patient and/or legal guardian expressed understanding and consented to video visit: Yes   PRESENTING CONCERNS: Patient and/or family reports the following symptoms/concerns: Pt states her primary goal is to prevent depression and anxiety as she experienced after previous pregnancy; took Zoloft for one year after previous pregnancy. Pt is open to learning self-coping strategies today, and will talk to medical provider about [place] medication postpartum Duration of problem: Current pregnancy; Severity of problem: moderate  STRENGTHS (Protective Factors/Coping Skills): Good social  support, open to treatment and strong self-awareness  GOALS ADDRESSED: Patient will: 1.  Reduce symptoms of: anxiety, depression and stress  2.  Increase knowledge and/or ability of: healthy habits and self-management skills  3.  Demonstrate ability to: Increase healthy adjustment to current life circumstances, Increase adequate support systems for patient/family and Increase motivation to adhere to plan of care  INTERVENTIONS: Interventions utilized:  Mindfulness or Relaxation Training, Brief CBT, Psychoeducation and/or Health Education and Link to [employer] Standardized Assessments completed: GAD-7 and PHQ 9  ASSESSMENT: Patient currently experiencing Adjustment disorder with mixed anxiety and depression.   Patient may benefit from psychoeducation and brief therapeutic interventions regarding coping with symptoms of anxiety, depression and life stress .  PLAN: 1. Follow up with behavioral health clinician on : Two weeks 2. Behavioral recommendations:  -Talk to medical provider at upcoming visit about [place] medication during pregnancy and/or postpartum -Continue taking prenatal vitamin until at least postpartum visit -Continue prioritizing healthy sleep throughout pregnancy and early postpartum -CALM relaxation breathing exercises twice daily(morning; at bedtime) -Begin Worry Time strategy tomorrow; practice daily -Consider resources provided on After Visit Summary today (Postpartum Planner, New mom online support groups; apps for emotional wellness) 3. Referral(s): Integrated Orthoptist (In Clinic) and [employer] Resources:  New mom support  I discussed the assessment and treatment plan with the patient and/or parent/guardian. They were provided an opportunity to ask questions and all were answered. They agreed with the plan and demonstrated an understanding of the instructions.   They were advised to call back or seek an in-person evaluation if the symptoms  worsen or if the condition fails to improve as anticipated.  Kimberly Randolph Kimberly Randolph  Depression screen [facility] 2/9 08/13/2019  Decreased Interest 1  Down, Depressed, Hopeless  0  PHQ - 2 Score 1  Altered sleeping 3  Tired, decreased energy 3  Change in appetite 0  Feeling bad or failure about yourself  0  Trouble concentrating 3  Moving slowly or fidgety/restless 0  Suicidal thoughts 0  PHQ-9 Score 10   GAD 7 : Generalized Anxiety Score 08/13/2019  Nervous, Anxious, on Edge 3  Control/stop worrying 3  Worry too much - different things 2  Trouble relaxing 3  Restless 0  Easily annoyed or irritable 2  Afraid - awful might happen 0  Total GAD 7 Score 13

## 2019-08-13 NOTE — Patient Instructions (Addendum)
[email or web address] Www.[email or web address].net     BRAINSTORMING  Develop a Plan Goals: . Provide a way to start conversation about your new life with a baby . Assist parents in recognizing and using resources within their reach . Help pave the way before birth for an easier period of transition afterwards.  Make a list of the following information to keep in a central location: . Full name of Mom and Partner: _____________________________________________ . 72 full name and Date of Birth: ___________________________________________ . Home Address: ___________________________________________________________ ________________________________________________________________________ . Home Phone: ____________________________________________________________ . Parents' cell numbers: _____________________________________________________ ________________________________________________________________________ . Name and contact info for OB: ______________________________________________ . Name and contact info for Pediatrician:________________________________________ . Contact info for Lactation Consultants: ________________________________________  REST and SLEEP *You each need at least 4-5 hours of uninterrupted sleep every day. Write specific names and contact information.* . How are you going to rest in the postpartum period? While partner's home? When partner returns to work? When you both return to work? Marland Kitchen Where will your baby sleep? Marland Kitchen Who is available to help during the day? Evening? Night? . Who could move in for a period to help support you? Marland Kitchen What are some ideas to help you get enough sleep? __________________________________________________________________________________________________________________________________________________________________________________________________________________________________________ NUTRITIOUS FOOD AND DRINK *Plan for meals before your baby  is born so you can have healthy food to eat during the immediate postpartum period.* . Who will look after breakfast? Lunch? Dinner? List names and contact information. Brainstorm quick, healthy ideas for each meal. . What can you do before baby is born to prepare meals for the postpartum period? . How can others help you with meals? Marland Kitchen Which grocery stores provide online shopping and delivery? Marland Kitchen Which restaurants offer take-out or delivery options? ______________________________________________________________________________________________________________________________________________________________________________________________________________________________________________________________________________________________________________________________________________________________________________________________________  CARE FOR MOM *It's important that mom is cared for and pampered in the postpartum period. Remember, the most important ways new mothers need care are: sleep, nutrition, gentle exercise, and time off.* . Who can come take care of mom during this period? Make a list of people with their contact information. . List some activities that make you feel cared for, rested, and energized? Who can make sure you have opportunities to do these things? . Does mom have a space of her very own within your home that's just for her? Make a "[facility]" where she can be comfortable, rest, and renew herself daily. ______________________________________________________________________________________________________________________________________________________________________________________________________________________________________________________________________________________________________________________________________________________________________________________________________    CARE FOR AND FEEDING BABY *Knowledgeable and encouraging people will offer the best  support with regard to feeding your baby.* . Educate yourself and choose the best feeding option for your baby. . Make a list of people who will guide, support, and be a resource for you as your care for and feed your baby. (Friends that have breastfed or are currently breastfeeding, lactation consultants, breastfeeding support groups, etc.) . Consider a postpartum doula. (These websites can give you information: dona.org & BuyingShow.es) . Seek out local breastfeeding resources like the breastfeeding support group at [employer] or [employer]. ______________________________________________________________________________________________________________________________________________________________________________________________________________________________________________________________________________________________________________________________________________________________________________________________________  Verner Chol AND ERRANDS . Who can help with a thorough cleaning before baby is born? . Make a list of people who will help with housekeeping and chores, like laundry, light cleaning, dishes, bathrooms, etc. . Who can run some errands for you? Marland Kitchen What can you do to make sure you are stocked with basic supplies before baby is born? . Who is going to do the shopping? ______________________________________________________________________________________________________________________________________________________________________________________________________________________________________________________________________________________________________________________________________________________________________________________________________     Family Adjustment *Nurture yourselves.it helps parents be more loving and allows for better bonding  with their child.* . What sorts of things do you and partner enjoy doing together? Which activities help you to connect and  strengthen your relationship? Make a list of those things. Make a list of people whom you trust to care for your baby so you can have some time together as a couple. . What types of things help partner feel connected to Mom? Make a list. . What needs will partner have in order to bond with baby? . Other children? Who will care for them when you go into labor and while you are in the hospital? . Think about what the needs of your older children might be. Who can help you meet those needs? In what ways are you helping them prepare for bringing baby home? List some specific strategies you have for family adjustment. _______________________________________________________________________________________________________________________________________________________________________________________________________________________________________________________________________________________________________________________________________________  SUPPORT *Someone who can empathize with experiences normalizes your problems and makes them more bearable.* . Make a list of other friends, neighbors, and/or co-workers you know with infants (and small children, if applicable) with whom you can connect. . Make a list of local or online support groups, mom groups, etc. in which you can be involved. ______________________________________________________________________________________________________________________________________________________________________________________________________________________________________________________________________________________________________________________________________________________________________________________________________  Childcare Plans . Investigate and plan for childcare if mom is returning to work. . Talk about mom's concerns about her transition back to work. . Talk about partner's concerns regarding this transition.  Mental Health *Your  mental health is one of the highest priorities for a pregnant or postpartum mom.* . 1 in 5 women experience anxiety and/or depression from the time of conception through the first year after birth. . Postpartum Mood Disorders are the #1 complication of pregnancy and childbirth and the suffering experienced by these mothers is not necessary! These illnesses are temporary and respond well to treatment, which often includes self-care, social support, talk therapy, and medication when needed. . Women experiencing anxiety and depression often say things like: "I'm supposed to be happy.why do I feel so sad?", "Why can't I snap out of it?", "I'm having thoughts that scare me." . There is no need to be embarrassed if you are feeling these symptoms: o Overwhelmed, anxious, angry, sad, guilty, irritable, hopeless, exhausted but can't sleep o You are NOT alone. You are NOT to blame. With help, you WILL be well. . Where can I find help? Medical professionals such as your OB, midwife, gynecologist, family practitioner, primary care provider, pediatrician, or mental health providers; [facility] support groups: Feelings After Birth, Breastfeeding Support Group, Baby and Me Group, and Fit 4 Two exercise classes. . You have permission to ask for help. It will confirm your feelings, validate your experiences, share/learn coping strategies, and gain support and encouragement as you heal. You are important! BRAINSTORM . Make a list of local resources, including resources for mom and for partner. . Identify support groups. . Identify people to call late at night - include names and contact info. . Talk with partner about perinatal mood and anxiety disorders. . Talk with your OB, midwife, and doula about baby blues and about perinatal mood and anxiety disorders. . Talk with your pediatrician about perinatal mood and anxiety disorders.   Support & Sanity Savers   What do you really need?  . Basics . In  preparing for a new baby, many expectant parents spend hours shopping for baby clothes, decorating the nursery, and deciding which car seat to buy. Yet most don't think much about what the reality of parenting a newborn will be like, and what  they need to make it through that. So, here is the advice of experienced parents. We know you'll read this, and think "they're exaggerating, I don't really need that." Just trust [place] on these, OK? Plan for all of this, and if it turns out you don't need it, come back and teach [place] how you did it!  Satira Anis (Once baby's survival needs are met, make sure you attend to your own survival needs!) . Sleep . An average newborn sleeps 16-18 hours per day, over 6-7 sleep periods, rarely more than three hours at a time. It is normal and healthy for a newborn to wake throughout the night... but really hard on parents!! . Naps. Prioritize sleep above any responsibilities like: cleaning house, visiting friends, running errands, etc.  Sleep whenever baby sleeps. If you can't nap, at least have restful times when baby eats. The more rest you get, the more patient you will be, the more emotionally stable, and better at solving problems.  . Food . You may not have realized it would be difficult to eat when you have a newborn. Yet, when we talk to . countless new parents, they say things like "it may be 2:00 pm when I realize I haven't had breakfast yet." Or "every time we sit down to dinner, baby needs to eat, and my food gets cold, so I don't bother to eat it." . Finger food. Before your baby is born, stock up with one months' worth of food that: 1) you can eat with one hand while holding a baby, 2) doesn't need to be prepped, 3) is good hot or cold, 4) doesn't spoil when left out for a few hours, and 5) you like to eat. Think about: nuts, dried fruit, Clif bars, pretzels, jerky, gogurt, baby carrots, apples, bananas, crackers, cheez-n-crackers, string cheese, hot pockets or frozen  burritos to microwave, garden burgers and breakfast pastries to put in the toaster, yogurt drinks, etc. . Restaurant Menus. Make lists of your favorite restaurants & menu items. When family/friends want to help, you can give specific information without much thought. They can either bring you the food or send gift cards for just the right meals. Rosaura Carpenter Meals.  Take some time to make a few meals to put in the freezer ahead of time.  Easy to freeze meals can be anything such as soup, lasagna, chicken pie, or spaghetti sauce. . Set up a Meal Schedule.  Ask friends and family to sign up to bring you meals during the first few weeks of being home. (It can be passed around at baby showers!) You have no idea how helpful this will be until you are in the throes of parenting.  https://hamilton-woodard.[email or web address]/ is a great website to check out. . Emotional Support . Know who to call when you're stressed out. Parenting a newborn is very challenging work. There are times when it totally overwhelms your normal coping abilities. EVERY NEW PARENT NEEDS TO HAVE A PLAN FOR WHO TO CALL WHEN THEY JUST CAN'T COPE ANY MORE. (And it has to be someone other than the baby's other parent!) Before your baby is born, come up with at least one person you can call for support - write their phone number down and post it on the refrigerator. Marland Kitchen Anxiety & Sadness. Baby blues are normal after pregnancy; however, there are more severe types of anxiety & sadness which can occur and should not be ignored.  They are always treatable, but you have to take  the first step by reaching out for help. [facility] offers a "Mom Talk" group which meets every Tuesday from 10 am - 11 am.  This group is for new moms who need support and connection after their babies are born.  Call [phone].  Marland Kitchen Really, Really Helpful (Plan for them! Make sure these happen often!!) . Physical Support with Taking Care of Yourselves . Asking friends and family. Before your  baby is born, set up a schedule of people who can come and visit and help out (or ask a friend to schedule for you). Any time someone says "let me know what I can do to help," sign them up for a day. When they get there, their job is not to take care of the baby (that's your job and your joy). Their job is to take care of you!  . Postpartum doulas. If you don't have anyone you can call on for support, look into postpartum doulas:  professionals at helping parents with caring for baby, caring for themselves, getting breastfeeding started, and helping with household tasks. www.padanc.org is a helpful website for learning about doulas in our area. . Peer Support / Parent Groups . Why: One of the greatest ideas for new parents is to be around other new parents. Parent groups give you a chance to share and listen to others who are going through the same season of life, get a sense of what is normal infant development by watching several babies learn and grow, share your stories of triumph and struggles with empathetic ears, and forgive your own mistakes when you realize all parents are learning by trial and error. . Where to find: There are many places you can meet other new parents throughout our community.  [facility] offers the following classes for new moms and their little ones:  Baby and Me (Birth to [place]) and Breastfeeding Support Group. Go to www.conehealthybaby.com or call [phone] for more information. . Time for your Relationship . It's easy to get so caught up in meeting baby's immediate needs that it's hard to find time to connect with your partner, and meet the needs of your relationship. It's also easy to forget what "quality time with your partner" actually looks like. If you take your baby on a date, you'd be amazed how much of your couple time is spent feeding the baby, diapering the baby, admiring the baby, and talking about the baby. . Dating: Try to take time for just the two of  you. Babysitter tip: Sometimes when moms are breastfeeding a newborn, they find it hard to figure out how to schedule outings around baby's unpredictable feeding schedules. Have the babysitter come for a three hour period. When she comes over, if baby has just eaten, you can leave right away, and come back in two hours. If baby hasn't fed recently, you start the date at home. Once baby gets hungry and gets a good feeding in, you can head out for the rest of your date time. . Date Nights at Home: If you can't get out, at least set aside one evening a week to prioritize your relationship: whenever baby dozes off or doesn't have any immediate needs, spend a little time focusing on each other. . Potential conflicts: The main relationship conflicts that come up for new parents are: issues related to sexuality, financial stresses, a feeling of an unfair division of household tasks, and conflicts in parenting styles. The more you can work on these issues before baby  arrives, the better!  Clint Guy and Frills (Don't forget these. and don't feel guilty for indulging in them!) . Everyone has something in life that is a fun little treat that they do just for themselves. It may be: reading the morning paper, or going for a daily jog, or having coffee with a friend once a week, or going to a movie on Friday nights, or fine chocolates, or bubble baths, or curling up with a good book. . Unless you do fun things for yourself every now and then, it's hard to have the energy for fun with your baby. Whatever your "special" treats are, make sure you find a way to continue to indulge in them after your baby is born. These special moments can recharge you, and allow you to return to baby with a new joy   PERINATAL MOOD DISORDERS: Lilly   Emergency and Crisis Resources:  If you are an imminent risk to self or others, are experiencing intense personal distress, and/or  have noticed significant changes in activities of daily living, call:  . 911 . [facility]: [phone] . Mobile Crisis: [phone] . National Suicide Hotline: 534-186-5885 Or visit the following crisis centers: . Local Emergency Departments . [facility]: [address], [place]. Hours: 8:30AM-5PM. Insurance Accepted: Medicaid, Medicare, and Uninsured.  Marland Kitchen [facility]  [address] [place] Mon-Friday 8am-3pm  [phone]                                                                                    Non-Crisis Resources: To identify specific providers that are covered by your insurance, contact your insurance company or local agencies: [place]: [phone] [facility] and [place]: [phone] [place]: [phone] Postpartum Support International- [place] 1-838-622-6993                                                      Outpatient therapy and medication management providers:  [facility] [phone] Hours: 9AM-5PM  Insurance Accepted: Alben Spittle, Lorella Nimrod, Freddrick March, [place], Medicare  [facility] ([place]) [phone] Hours: 8AM-5PM  nsurance Accepted: All insurances EXCEPT AARP, [place], [place], and [place]: [phone]             Hours: 8AM-8PM Insurance Accepted: Cristal Ford, Freddrick March, [place], Medicare, [id number] [facility]: [phone] Hours: 8:30AM-7PM Insurance Accepted: Cristal Ford, Medicaid, Medicare, Tricare, [employer] Counseling:  [place] Accepted:  Holland Falling, Lorella Nimrod, [facility], [place], [facility] [phone] Hours: 9AM-5:30PM Insurance Accepted: Alben Spittle, Charlotte Crumb, and Medicaid, Medicare,  [employer] [facility]:  [phone] Hours: 9am-5pm Insurance Accepted: BCBS; they do not accept Medicaid/Medicare [place]: [phone] Hours: 9am-9pm Insurance Accepted: All major insurance including Medicaid and Medicare [facility]  [facility]: [phone] Hours: 9AM-5:30PM Insurance Accepted: All insurances EXCEPT Medicaid and Medicare. [facility]: [place]: [phone] [place]:  [place] (support for children in the NICU and/or with special needs), [place]: [phone]                                                                                     Online Resources: Postpartum Support International: http://jones-berg.com/  800-944-4PPD 2Moms Supporting Moms:  www.momssupportingmoms.net   /Emotional [employer] and Websites Here are a few free apps meant to help you to help yourself.  To find, try searching on the internet to see if the app is offered on Apple/Android devices. If your first choice doesn't come up on your device, the good news is that there are many choices! Play around with different apps to see which ones are helpful to you.    Calm This is an app meant to help increase calm feelings. Includes info, strategies, and tools for tracking your feelings.      Calm Harm  This app is meant to help with self-harm. Provides many 5-minute or 15-min coping strategies for doing instead of hurting yourself.       [place] is a problem-solving tool to help deal with emotions and cope with stress you encounter wherever you are.      MindShift This app can help people cope with anxiety. Rather than  trying to avoid anxiety, you can make an important shift and face it.      MY3  MY3 features a support system, safety plan and resources with the goal of offering a tool to use in a time of need.       My Life My Voice  This mood journal offers a simple solution for tracking your thoughts, feelings and moods. Animated emoticons can help identify your mood.       Relax Melodies Designed to help with sleep, on this app you can mix sounds and meditations for relaxation.      Smiling Mind Smiling Mind is meditation made easy: it's a simple tool that helps put a smile on your mind.        Stop, Breathe & Think  A friendly, simple guide for people through meditations for mindfulness and compassion.  Stop, Breathe and Think Kids Enter your current feelings and choose a "mission" to help you cope. Offers videos for certain moods instead of just sound recordings.       Team Orange The goal of this tool is to help teens change how they think, act, and react. This app helps you focus on your own good feelings and experiences.      The Ash[place] Box The [place] Box (VHB) contains simple tools to help patients with coping, relaxation, distraction, and positive thinking.

## 2019-08-18 ENCOUNTER — Other Ambulatory Visit: Payer: Self-pay

## 2019-08-18 ENCOUNTER — Ambulatory Visit (INDEPENDENT_AMBULATORY_CARE_PROVIDER_SITE_OTHER): Payer: BC Managed Care – PPO | Admitting: Obstetrics and Gynecology

## 2019-08-18 ENCOUNTER — Encounter: Payer: Self-pay | Admitting: Radiology

## 2019-08-18 VITALS — BP 114/78 | HR 78 | Wt 210.0 lb

## 2019-08-18 DIAGNOSIS — O99343 Other mental disorders complicating pregnancy, third trimester: Secondary | ICD-10-CM

## 2019-08-18 DIAGNOSIS — Z113 Encounter for screening for infections with a predominantly sexual mode of transmission: Secondary | ICD-10-CM

## 2019-08-18 DIAGNOSIS — Z3403 Encounter for supervision of normal first pregnancy, third trimester: Secondary | ICD-10-CM

## 2019-08-18 DIAGNOSIS — Z3A37 37 weeks gestation of pregnancy: Secondary | ICD-10-CM

## 2019-08-18 DIAGNOSIS — Z3480 Encounter for supervision of other normal pregnancy, unspecified trimester: Secondary | ICD-10-CM

## 2019-08-18 DIAGNOSIS — O3663X0 Maternal care for excessive fetal growth, third trimester, not applicable or unspecified: Secondary | ICD-10-CM

## 2019-08-18 DIAGNOSIS — O283 Abnormal ultrasonic finding on antenatal screening of mother: Secondary | ICD-10-CM

## 2019-08-18 DIAGNOSIS — F329 Major depressive disorder, single episode, unspecified: Secondary | ICD-10-CM

## 2019-08-18 NOTE — Progress Notes (Signed)
Prenatal Visit Note Date: 08/18/2019 Clinic: [facility]  Subjective:  Kimberly Randolph is a 29 y.o. G2P1001 at [redacted]w[redacted]d being seen today for ongoing prenatal care.  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  Patient reports no complaints.   Contractions: Not present. Vag. Bleeding: None.  Movement: Present. Denies leaking of fluid.   The following portions of the patient's history were reviewed and updated as appropriate: allergies, current medications, past family history, past medical history, past social history, past surgical history and problem list. Problem list updated.  Objective:   Vitals:   08/18/19 1533  BP: 114/78  Pulse: 78  Weight: 210 lb (95.3 kg)    Fetal Status: Fetal Heart Rate (bpm): 135 Fundal Height: 36 cm Movement: Present  Presentation: Vertex  General:  Alert, oriented and cooperative. Patient is in no acute distress.  Skin: Skin is warm and dry. No rash noted.   Cardiovascular: Normal heart rate noted  Respiratory: Normal respiratory effort, no problems with respiration noted  Abdomen: Soft, gravid, appropriate for gestational age. Pain/Pressure: Present     Pelvic:  Cervical exam performed Dilation: 1 Effacement (%): 50 Station: Ballotable  Extremities: Normal range of motion.  Edema: None  Mental Status: Normal mood and affect. Normal behavior. Normal judgment and thought content.   Urinalysis:      Assessment and Plan:  Pregnancy: G2P1001 at [redacted]w[redacted]d  1. Encounter for supervision of normal first pregnancy in third trimester Routine care. BC options d/w her - Strep Gp B  Culture+Rflx - GC/Chlamydia probe amp ([place])not at [facility]  2. Excessive fetal growth affecting management of pregnancy in third trimester, single or unspecified fetus Follow up growth next week. Pt desiring vaginal birth trial which I told her is very reasonable given the variability of late third trimester u/s. I told her that unless the u/s shows an extremly large efw.   3. 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 To meet with Asher Muir later this week. Long d/w her re: starting SSRI now, postpartum or holding off. Pt was on it pp last pregnancy and it seemed to work well. I told her risk to fetus/newborn, especially if she breastfeeds is minimal to none. Pt to consider options  4. Pyelectasis of fetus on prenatal ultrasound Follow up postnatal u/s  Term labor symptoms and general obstetric precautions including but not limited to vaginal bleeding, contractions, leaking of fluid and fetal movement were reviewed in detail with the patient. Please refer to After Visit Summary for other counseling recommendations.  Return in about 1 week (around 08/25/2019) for high risk, virtual visit.   Smyer Bing, MD

## 2019-08-20 ENCOUNTER — Encounter: Payer: BC Managed Care – PPO | Admitting: Family Medicine

## 2019-08-20 LAB — GC/CHLAMYDIA PROBE AMP (~~LOC~~) NOT AT ARMC
Chlamydia: NEGATIVE
Comment: NEGATIVE
Comment: NORMAL
Neisseria Gonorrhea: NEGATIVE

## 2019-08-22 LAB — STREP GP B CULTURE+RFLX: Strep Gp B Culture+Rflx: NEGATIVE

## 2019-08-25 ENCOUNTER — Ambulatory Visit (HOSPITAL_COMMUNITY): Payer: BC Managed Care – PPO | Admitting: *Deleted

## 2019-08-25 ENCOUNTER — Ambulatory Visit (HOSPITAL_COMMUNITY)
Admission: RE | Admit: 2019-08-25 | Discharge: 2019-08-25 | Disposition: A | Discharge status: Home / Self Care | Payer: BC Managed Care – PPO | Source: Ambulatory Visit | Attending: Obstetrics and Gynecology | Admitting: Obstetrics and Gynecology

## 2019-08-25 ENCOUNTER — Encounter (HOSPITAL_COMMUNITY): Payer: Self-pay

## 2019-08-25 ENCOUNTER — Other Ambulatory Visit: Payer: Self-pay

## 2019-08-25 VITALS — BP 112/75 | HR 93 | Temp 97.7°F

## 2019-08-25 DIAGNOSIS — O0990 Supervision of high risk pregnancy, unspecified, unspecified trimester: Secondary | ICD-10-CM

## 2019-08-25 DIAGNOSIS — O3660X0 Maternal care for excessive fetal growth, unspecified trimester, not applicable or unspecified: Secondary | ICD-10-CM

## 2019-08-25 DIAGNOSIS — Z3A38 38 weeks gestation of pregnancy: Secondary | ICD-10-CM | POA: Diagnosis not present

## 2019-08-25 DIAGNOSIS — Z362 Encounter for other antenatal screening follow-up: Secondary | ICD-10-CM

## 2019-08-25 DIAGNOSIS — O283 Abnormal ultrasonic finding on antenatal screening of mother: Secondary | ICD-10-CM

## 2019-08-28 ENCOUNTER — Other Ambulatory Visit (HOSPITAL_COMMUNITY): Payer: Self-pay | Admitting: Advanced Practice Midwife

## 2019-08-28 ENCOUNTER — Telehealth (INDEPENDENT_AMBULATORY_CARE_PROVIDER_SITE_OTHER): Payer: BC Managed Care – PPO | Admitting: Obstetrics and Gynecology

## 2019-08-28 ENCOUNTER — Encounter (HOSPITAL_COMMUNITY): Payer: Self-pay | Admitting: *Deleted

## 2019-08-28 ENCOUNTER — Telehealth (HOSPITAL_COMMUNITY): Payer: Self-pay | Admitting: *Deleted

## 2019-08-28 ENCOUNTER — Other Ambulatory Visit: Payer: Self-pay

## 2019-08-28 VITALS — BP 117/77

## 2019-08-28 DIAGNOSIS — O3663X0 Maternal care for excessive fetal growth, third trimester, not applicable or unspecified: Secondary | ICD-10-CM

## 2019-08-28 DIAGNOSIS — O99343 Other mental disorders complicating pregnancy, third trimester: Secondary | ICD-10-CM

## 2019-08-28 DIAGNOSIS — Z3A38 38 weeks gestation of pregnancy: Secondary | ICD-10-CM

## 2019-08-28 DIAGNOSIS — F329 Major depressive disorder, single episode, unspecified: Secondary | ICD-10-CM

## 2019-08-28 DIAGNOSIS — F32A Depression, unspecified: Secondary | ICD-10-CM

## 2019-08-28 DIAGNOSIS — O283 Abnormal ultrasonic finding on antenatal screening of mother: Secondary | ICD-10-CM

## 2019-08-28 DIAGNOSIS — Z9889 Other specified postprocedural states: Secondary | ICD-10-CM

## 2019-08-28 DIAGNOSIS — Z3480 Encounter for supervision of other normal pregnancy, unspecified trimester: Secondary | ICD-10-CM

## 2019-08-28 NOTE — Progress Notes (Signed)
   TELEHEALTH VIRTUAL OBSTETRICS VISIT ENCOUNTER NOTE  Clinic: [facility]  I connected with Kimberly Randolph on 08/28/19 at 10:00 AM EDT by telephone at home and verified that I am speaking with the correct person using two identifiers.   I discussed the limitations, risks, security and privacy concerns of performing an evaluation and management service by telephone and the availability of in person appointments. I also discussed with the patient that there may be a patient responsible charge related to this service. The patient expressed understanding and agreed to proceed.  Subjective:  Kimberly Randolph is a 29 y.o. G2P1001 at [redacted]w[redacted]d being followed for ongoing prenatal care.  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  Patient reports no complaints. Reports fetal movement. Denies any contractions, bleeding or leaking of fluid.   The following portions of the patient's history were reviewed and updated as appropriate: allergies, current medications, past family history, past medical history, past social history, past surgical history and problem list.   Objective:   Vitals:   08/28/19 1005  BP: 117/77    Babyscripts Data Reviewed: yes  General:  Alert, oriented and cooperative.   Mental Status: Normal mood and affect perceived. Normal judgment and thought content.  Rest of physical exam deferred due to type of encounter  Assessment and Plan:  Pregnancy: G2P1001 at [redacted]w[redacted]d 1. Fourth degree perineal laceration during delivery  2. Supervision of other normal pregnancy, antepartum Routine care  3.  Depression during pregnancy in third trimester Doing well  4. History of episiotomy  5. Excessive fetal growth affecting management of pregnancy in third trimester, single or unspecified fetus On 3/15 and pt believes feels about the same size or bigger. D/w her re: r/b/a 39wk IOL to 41wk and she is amenable to 39wk IOL at midnight. Will call L&D and set it up. I also told her about FB ripening pre IOL and if she would like this to let us know when we call her back with the IOL date and time.   6. Pyelectasis of fetus on prenatal ultrasound Seen on rpt 3/15 u/s 42mm. Follow up postnatal imaging  Term labor symptoms and general obstetric precautions including but not limited to vaginal bleeding, contractions, leaking of fluid and fetal movement were reviewed in detail with the patient.  I discussed the assessment and treatment plan with the patient. The patient was provided an opportunity to ask questions and all were answered. The patient agreed with the plan and demonstrated an understanding of the instructions. The patient was advised to call back or seek an in-person office evaluation/go to [facility]U at [facility] for any urgent or concerning symptoms. Please refer to After Visit Summary for other counseling recommendations.   I provided 10 minutes of non-face-to-face time during this encounter. The visit was conducted via MyChart-medicine  No follow-ups on file.  Future Appointments  Date Time Provider Department Center  09/01/2019  4:15 PM Federico Flake, MD [facility]    [place] Bing, MD [facility], [facility]

## 2019-08-28 NOTE — Progress Notes (Signed)
BLOOD PRESSURE 117/77

## 2019-08-28 NOTE — Telephone Encounter (Signed)
Preadmission screen  

## 2019-09-01 ENCOUNTER — Other Ambulatory Visit
Admission: RE | Admit: 2019-09-01 | Discharge: 2019-09-01 | Disposition: A | Discharge status: Home / Self Care | Payer: BC Managed Care – PPO | Source: Ambulatory Visit | Attending: Family Medicine | Admitting: Family Medicine

## 2019-09-01 ENCOUNTER — Other Ambulatory Visit (HOSPITAL_COMMUNITY): Payer: BC Managed Care – PPO

## 2019-09-01 ENCOUNTER — Telehealth: Payer: BC Managed Care – PPO | Admitting: Family Medicine

## 2019-09-01 ENCOUNTER — Other Ambulatory Visit: Payer: Self-pay

## 2019-09-01 DIAGNOSIS — Z20822 Contact with and (suspected) exposure to covid-19: Secondary | ICD-10-CM | POA: Insufficient documentation

## 2019-09-01 DIAGNOSIS — Z01812 Encounter for preprocedural laboratory examination: Secondary | ICD-10-CM | POA: Insufficient documentation

## 2019-09-01 LAB — SARS CORONAVIRUS 2 (TAT 6-24 HRS): SARS Coronavirus 2: NEGATIVE

## 2019-09-02 ENCOUNTER — Ambulatory Visit (INDEPENDENT_AMBULATORY_CARE_PROVIDER_SITE_OTHER): Payer: BC Managed Care – PPO | Admitting: *Deleted

## 2019-09-02 ENCOUNTER — Encounter: Payer: Self-pay | Admitting: Obstetrics & Gynecology

## 2019-09-02 ENCOUNTER — Ambulatory Visit (INDEPENDENT_AMBULATORY_CARE_PROVIDER_SITE_OTHER): Payer: BC Managed Care – PPO | Admitting: Obstetrics & Gynecology

## 2019-09-02 ENCOUNTER — Ambulatory Visit: Payer: BC Managed Care – PPO | Admitting: *Deleted

## 2019-09-02 VITALS — BP 107/77 | HR 128 | Wt 212.0 lb

## 2019-09-02 DIAGNOSIS — Z3480 Encounter for supervision of other normal pregnancy, unspecified trimester: Secondary | ICD-10-CM

## 2019-09-02 DIAGNOSIS — Z3A39 39 weeks gestation of pregnancy: Secondary | ICD-10-CM | POA: Diagnosis not present

## 2019-09-02 DIAGNOSIS — O3663X0 Maternal care for excessive fetal growth, third trimester, not applicable or unspecified: Secondary | ICD-10-CM

## 2019-09-02 DIAGNOSIS — O09293 Supervision of pregnancy with other poor reproductive or obstetric history, third trimester: Secondary | ICD-10-CM

## 2019-09-02 DIAGNOSIS — F329 Major depressive disorder, single episode, unspecified: Secondary | ICD-10-CM

## 2019-09-02 DIAGNOSIS — O99343 Other mental disorders complicating pregnancy, third trimester: Secondary | ICD-10-CM

## 2019-09-02 NOTE — Progress Notes (Signed)
Patient seen and assessed by nursing staff during this encounter. I have reviewed the chart and agree with the documentation and plan. NST performed today was reviewed and was found to be reactive.   Jaynie Collins, MD 09/02/2019 11:49 AM

## 2019-09-02 NOTE — Progress Notes (Signed)
NST prior to foley placement

## 2019-09-02 NOTE — Progress Notes (Signed)
NST post foley bulb placement

## 2019-09-02 NOTE — Patient Instructions (Signed)

## 2019-09-02 NOTE — Progress Notes (Signed)
Patient seen and assessed by nursing staff during this encounter. I have reviewed the chart and agree with the documentation and plan. NST performed today was reviewed and was found to be reactive.    Jaynie Collins, MD 09/02/2019 11:48 AM

## 2019-09-02 NOTE — Progress Notes (Signed)
    PRENATAL VISIT NOTE  Subjective:  Kimberly Randolph is a 29 y.o. G2P1001 at [redacted]w[redacted]d being seen today for ongoing prenatal care.  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  Patient reports no complaints. No bleeding.  Reports no contractions.  Good fetal movement. Denies leaking of fluid.   The following portions of the patient's history were reviewed and updated as appropriate: allergies, current medications, past family history, past medical history, past social history, past surgical history and problem list. Problem list updated.  Objective:  BP 107/77  P 128 (patient is nervous)  Wt 212 lb  General:  Alert, oriented and cooperative. Patient is in no acute distress.  Skin: Skin is warm and dry. No rash noted.   Cardiovascular: Normal heart rate noted  Respiratory: Normal respiratory effort, no problems with respiration noted  Abdomen: Soft, gravid, appropriate for gestational age.        Pelvic: Cervical exam performed in the presence of chaperone. Visually 1 cm dilated.        Extremities: Normal range of motion.     Mental Status:  Normal mood and affect. Normal behavior. Normal judgment and thought content.  Procedure: Patient informed of R/B/A of procedure. NST was performed and was reactive prior to procedure. NST:  EFM: Baseline: 150 bpm, Variability: Moderate, Accelerations: Reactive and Decelerations: Absent Toco: none Procedure done to begin ripening of the cervix prior to admission for induction of labor. Appropriate time out taken. The patient was placed in the lithotomy position and the cervix brought into  view with sterile speculum. A ring forcep was used to guide the 84F foley balloon through the internal os of the cervix. Foley Balloon filled with 60 ml of normal saline.  Foley placed on tension and taped to medial thigh.  NST:  EFM Baseline: 150 bpm, Variability: Moderate, Accelerations: Reactive and Decelerations: Absent  Toco: none There were no signs of tachysystole or hypertonus. All equipment was removed and accounted for. The patient tolerated the procedure well.  Assessment and Plan:  Pregnancy: G2P1001 at [redacted]w[redacted]d Supervision of other normal pregnancy, antepartum S/p Outpatient placement of foley balloon catheter for cervical ripening. Induction of labor scheduled for tomorrow at 1200. Reassuring FHR tracing with no concerns at present. Warning signs given to patient to include return to MAU for heavy vaginal bleeding, Rupture of membranes, painful uterine contractions q 5 mins or less, severe abdominal discomfort, decreased fetal movement.  Return for Postpartum check.   Jaynie Collins, MD 09/02/2019 9:40 AM

## 2019-09-03 ENCOUNTER — Inpatient Hospital Stay (HOSPITAL_COMMUNITY): Payer: BC Managed Care – PPO

## 2019-09-03 ENCOUNTER — Inpatient Hospital Stay (HOSPITAL_COMMUNITY): Payer: BC Managed Care – PPO | Admitting: Anesthesiology

## 2019-09-03 ENCOUNTER — Inpatient Hospital Stay (HOSPITAL_COMMUNITY)
Admission: AD | Admit: 2019-09-03 | Discharge: 2019-09-04 | DRG: 807 | Disposition: A | Discharge status: Home / Self Care | Payer: BC Managed Care – PPO | Attending: Obstetrics and Gynecology | Admitting: Obstetrics and Gynecology

## 2019-09-03 ENCOUNTER — Other Ambulatory Visit: Payer: Self-pay

## 2019-09-03 ENCOUNTER — Encounter (HOSPITAL_COMMUNITY): Payer: Self-pay | Admitting: Obstetrics and Gynecology

## 2019-09-03 DIAGNOSIS — O26893 Other specified pregnancy related conditions, third trimester: Secondary | ICD-10-CM | POA: Diagnosis present

## 2019-09-03 DIAGNOSIS — Z3A39 39 weeks gestation of pregnancy: Secondary | ICD-10-CM

## 2019-09-03 DIAGNOSIS — Z880 Allergy status to penicillin: Secondary | ICD-10-CM | POA: Diagnosis not present

## 2019-09-03 DIAGNOSIS — O283 Abnormal ultrasonic finding on antenatal screening of mother: Secondary | ICD-10-CM | POA: Diagnosis present

## 2019-09-03 DIAGNOSIS — Z20822 Contact with and (suspected) exposure to covid-19: Secondary | ICD-10-CM | POA: Diagnosis present

## 2019-09-03 DIAGNOSIS — O09293 Supervision of pregnancy with other poor reproductive or obstetric history, third trimester: Secondary | ICD-10-CM | POA: Diagnosis not present

## 2019-09-03 DIAGNOSIS — O99343 Other mental disorders complicating pregnancy, third trimester: Secondary | ICD-10-CM | POA: Diagnosis present

## 2019-09-03 DIAGNOSIS — Z3480 Encounter for supervision of other normal pregnancy, unspecified trimester: Secondary | ICD-10-CM

## 2019-09-03 DIAGNOSIS — O3663X0 Maternal care for excessive fetal growth, third trimester, not applicable or unspecified: Secondary | ICD-10-CM | POA: Diagnosis present

## 2019-09-03 DIAGNOSIS — Z3490 Encounter for supervision of normal pregnancy, unspecified, unspecified trimester: Secondary | ICD-10-CM | POA: Diagnosis present

## 2019-09-03 DIAGNOSIS — Z8759 Personal history of other complications of pregnancy, childbirth and the puerperium: Secondary | ICD-10-CM | POA: Diagnosis present

## 2019-09-03 DIAGNOSIS — F329 Major depressive disorder, single episode, unspecified: Secondary | ICD-10-CM | POA: Diagnosis present

## 2019-09-03 LAB — RPR: RPR Ser Ql: NONREACTIVE

## 2019-09-03 LAB — CBC
HCT: 35.6 % — ABNORMAL LOW (ref 36.0–46.0)
Hemoglobin: 12.2 g/dL (ref 12.0–15.0)
MCH: 30.1 pg (ref 26.0–34.0)
MCHC: 34.3 g/dL (ref 30.0–36.0)
MCV: 87.9 fL (ref 80.0–100.0)
Platelets: 222 10*3/uL (ref 150–400)
RBC: 4.05 MIL/uL (ref 3.87–5.11)
RDW: 12.7 % (ref 11.5–15.5)
WBC: 11.7 10*3/uL — ABNORMAL HIGH (ref 4.0–10.5)
nRBC: 0 % (ref 0.0–0.2)

## 2019-09-03 LAB — TYPE AND SCREEN
ABO/RH(D): A POS
Antibody Screen: NEGATIVE

## 2019-09-03 LAB — ABO/RH: ABO/RH(D): A POS

## 2019-09-03 MED ORDER — TETANUS-DIPHTH-ACELL PERTUSSIS 5-2.5-18.5 LF-MCG/0.5 IM SUSP: 0.5000 mL | Freq: Once | INTRAMUSCULAR | Status: DC

## 2019-09-03 MED ORDER — SENNOSIDES-DOCUSATE SODIUM 8.6-50 MG PO TABS
2.0000 | ORAL_TABLET | ORAL | Status: DC
  Administered 2019-09-03: 2 via ORAL
  Filled 2019-09-03: qty 2

## 2019-09-03 MED ORDER — SIMETHICONE 80 MG PO CHEW: 80.0000 mg | CHEWABLE_TABLET | ORAL | Status: DC | PRN

## 2019-09-03 MED ORDER — FENTANYL-BUPIVACAINE-NACL 0.5-0.125-0.9 MG/250ML-% EP SOLN: 12.0000 mL/h | EPIDURAL | Status: DC | PRN

## 2019-09-03 MED ORDER — LACTATED RINGERS IV SOLN: 500.0000 mL | Freq: Once | INTRAVENOUS | Status: DC

## 2019-09-03 MED ORDER — OXYTOCIN 40 UNITS IN NORMAL SALINE INFUSION - SIMPLE MED
2.5000 [IU]/h | INTRAVENOUS | Status: DC
  Filled 2019-09-03: qty 1000

## 2019-09-03 MED ORDER — PHENYLEPHRINE 40 MCG/ML (10ML) SYRINGE FOR IV PUSH (FOR BLOOD PRESSURE SUPPORT): 80.0000 ug | PREFILLED_SYRINGE | INTRAVENOUS | Status: DC | PRN

## 2019-09-03 MED ORDER — LACTATED RINGERS IV SOLN: 500.0000 mL | INTRAVENOUS | Status: DC | PRN

## 2019-09-03 MED ORDER — COCONUT OIL OIL: 1.0000 "application " | TOPICAL_OIL | Status: DC | PRN

## 2019-09-03 MED ORDER — WITCH HAZEL-GLYCERIN EX PADS: 1.0000 "application " | MEDICATED_PAD | CUTANEOUS | Status: DC | PRN

## 2019-09-03 MED ORDER — ONDANSETRON HCL 4 MG/2ML IJ SOLN: 4.0000 mg | Freq: Four times a day (QID) | INTRAMUSCULAR | Status: DC | PRN

## 2019-09-03 MED ORDER — EPHEDRINE 5 MG/ML INJ: 10.0000 mg | INTRAVENOUS | Status: DC | PRN

## 2019-09-03 MED ORDER — DIPHENHYDRAMINE HCL 50 MG/ML IJ SOLN: 12.5000 mg | INTRAMUSCULAR | Status: DC | PRN

## 2019-09-03 MED ORDER — FENTANYL CITRATE (PF) 100 MCG/2ML IJ SOLN: 50.0000 ug | INTRAMUSCULAR | Status: DC | PRN

## 2019-09-03 MED ORDER — LIDOCAINE-EPINEPHRINE (PF) 2 %-1:200000 IJ SOLN
INTRAMUSCULAR | Status: DC | PRN
  Administered 2019-09-03 (×2): 2 mL via EPIDURAL

## 2019-09-03 MED ORDER — OXYCODONE HCL 5 MG PO TABS: 5.0000 mg | ORAL_TABLET | ORAL | Status: DC | PRN

## 2019-09-03 MED ORDER — SOD CITRATE-CITRIC ACID 500-334 MG/5ML PO SOLN: 30.0000 mL | ORAL | Status: DC | PRN

## 2019-09-03 MED ORDER — OXYTOCIN BOLUS FROM INFUSION
500.0000 mL | Freq: Once | INTRAVENOUS | Status: AC
  Administered 2019-09-03: 500 mL via INTRAVENOUS

## 2019-09-03 MED ORDER — MISOPROSTOL 25 MCG QUARTER TABLET: 25.0000 ug | ORAL_TABLET | ORAL | Status: DC | PRN

## 2019-09-03 MED ORDER — TERBUTALINE SULFATE 1 MG/ML IJ SOLN: 0.2500 mg | Freq: Once | INTRAMUSCULAR | Status: DC | PRN

## 2019-09-03 MED ORDER — ACETAMINOPHEN 325 MG PO TABS
650.0000 mg | ORAL_TABLET | ORAL | Status: DC | PRN
  Administered 2019-09-03: 650 mg via ORAL

## 2019-09-03 MED ORDER — ZOLPIDEM TARTRATE 5 MG PO TABS: 5.0000 mg | ORAL_TABLET | Freq: Every evening | ORAL | Status: DC | PRN

## 2019-09-03 MED ORDER — ONDANSETRON HCL 4 MG PO TABS: 4.0000 mg | ORAL_TABLET | ORAL | Status: DC | PRN

## 2019-09-03 MED ORDER — OXYCODONE HCL 5 MG PO TABS: 10.0000 mg | ORAL_TABLET | ORAL | Status: DC | PRN

## 2019-09-03 MED ORDER — DIPHENHYDRAMINE HCL 25 MG PO CAPS: 25.0000 mg | ORAL_CAPSULE | Freq: Four times a day (QID) | ORAL | Status: DC | PRN

## 2019-09-03 MED ORDER — DIBUCAINE (PERIANAL) 1 % EX OINT: 1.0000 "application " | TOPICAL_OINTMENT | CUTANEOUS | Status: DC | PRN

## 2019-09-03 MED ORDER — PRENATAL MULTIVITAMIN CH
1.0000 | ORAL_TABLET | Freq: Every day | ORAL | Status: DC
  Administered 2019-09-04: 1 via ORAL
  Filled 2019-09-03: qty 1

## 2019-09-03 MED ORDER — BENZOCAINE-MENTHOL 20-0.5 % EX AERO
1.0000 "application " | INHALATION_SPRAY | CUTANEOUS | Status: DC | PRN
  Administered 2019-09-03: 1 via TOPICAL

## 2019-09-03 MED ORDER — OXYTOCIN 40 UNITS IN NORMAL SALINE INFUSION - SIMPLE MED
1.0000 m[IU]/min | INTRAVENOUS | Status: DC
  Administered 2019-09-03: 2 m[IU]/min via INTRAVENOUS

## 2019-09-03 MED ORDER — LACTATED RINGERS IV SOLN: INTRAVENOUS | Status: DC

## 2019-09-03 MED ORDER — SODIUM CHLORIDE (PF) 0.9 % IJ SOLN
INTRAMUSCULAR | Status: DC | PRN
  Administered 2019-09-03: 12 mL/h via EPIDURAL

## 2019-09-03 MED ORDER — IBUPROFEN 600 MG PO TABS
600.0000 mg | ORAL_TABLET | Freq: Four times a day (QID) | ORAL | Status: DC
  Administered 2019-09-03 – 2019-09-04 (×4): 600 mg via ORAL
  Filled 2019-09-03 (×4): qty 1

## 2019-09-03 MED ORDER — ACETAMINOPHEN 325 MG PO TABS: 650.0000 mg | ORAL_TABLET | ORAL | Status: DC | PRN

## 2019-09-03 MED ORDER — ONDANSETRON HCL 4 MG/2ML IJ SOLN: 4.0000 mg | INTRAMUSCULAR | Status: DC | PRN

## 2019-09-03 MED ORDER — LIDOCAINE HCL (PF) 1 % IJ SOLN: 30.0000 mL | INTRAMUSCULAR | Status: DC | PRN

## 2019-09-03 MED ORDER — FENTANYL-BUPIVACAINE-NACL 0.5-0.125-0.9 MG/250ML-% EP SOLN
EPIDURAL | Status: AC
  Filled 2019-09-03: qty 250

## 2019-09-03 NOTE — H&P (Signed)
LABOR AND DELIVERY ADMISSION HISTORY AND PHYSICAL NOTE  Kimberly Randolph is a 29 y.o. female G2P1001 with IUP at 85w2dby LMP c/w 18wk UKoreapresenting for elective IOL with hx of 4th degree laceration.   She reports positive fetal movement. She denies leakage of fluid, vaginal bleeding, or contractions.   She plans on breast feeding. Her contraception plan is: IUD, outpatient.  [facility] , CWD, normal anatomy, cephalic presentation, posterior placenta, 94%ile, EFW 3883 grams  Pregnancy complications:  - hx of fourth degree laceration after VAVD  Past Medical History: Past Medical History:  Diagnosis Date  . Fourth degree perineal laceration during delivery 10/05/2015  . Heart murmur     Past Surgical History: Past Surgical History:  Procedure Laterality Date  . WISDOM TOOTH EXTRACTION      Obstetrical History: OB History    Gravida  2   Para  1   Term  1   Preterm      AB      Living  1     SAB      TAB      Ectopic      Multiple  0   Live Births  1           Social History: Social History   Socioeconomic History  . Marital status: Married    Spouse name: Not on file  . Number of children: Not on file  . Years of education: Not on file  . Highest education level: Not on file  Occupational History  . 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 Smoking status: Never Smoker  . Smokeless tobacco: Never Used  Substance and Sexual Activity  . Alcohol use: No  . Drug use: No  . Sexual activity: Not Currently    Birth control/protection: None  Other Topics Concern  . Not on file  Social History Narrative  . Not on file   Social Determinants of Health   Financial Resource Strain:   . Difficulty of Paying Living Expenses:   Food Insecurity:   . Worried About RCharity fundraiserin the Last Year:   . RArboriculturistin the Last Year:   Transportation Needs:   . LLexicographer(Medical):   .Marland KitchenLack of Transportation (Non-Medical):   Physical Activity:   . Days of Exercise per Week:   . Minutes of Exercise per Session:   Stress:   . Feeling of Stress :   Social Connections:   . Frequency of Communication with Friends and Family:   . Frequency of Social Gatherings with Friends and Family:   . Attends Religious Services:   . Active Member of Clubs or Organizations:   . Attends CArchivistMeetings:   .Marland KitchenMarital Status:     Family History: Family History  Problem Relation Age of Onset  . Cancer Mother 456      Colon Cancer  . Cancer Father 525      Bladder  . Diabetes Father        Type 2  . Hypertension Father   . Birth defects Sister infancy       Heart valve defect  . Diabetes Paternal Grandmother   . Cancer Paternal Grandfather        Pancreatic    Allergies: Allergies  Allergen Reactions  . Penicillins Rash    Has patient had a  PCN reaction causing immediate rash, facial/tongue/throat swelling, SOB or lightheadedness with hypotension: unknown Has patient had a PCN reaction causing severe rash involving mucus membranes or skin necrosis: unknown Has patient had a PCN reaction that required hospitalization unknown Has patient had a PCN reaction occurring within the last 10 years: no If all of the above answers are "NO", then may proceed with Cephalosporin use.     Medications Prior to Admission  Medication Sig Dispense Refill Last Dose  . Blood Pressure KIT Check blood pressure 1-2 times per week 1 kit 0   . Cetirizine HCl (ZYRTEC PO) Take by mouth.     . Misc. Devices (MEDELA DOUBLE BREAST PUMP) MISC 1 Units by Does not apply route as needed. (Patient not taking: Reported on 08/28/2019) 1 each 0   . Prenatal Vit-Fe Fumarate-FA (PRENATAL VITAMINS PO) Take by mouth.        Review of Systems  All systems reviewed and negative except as stated in HPI  Physical Exam Last menstrual period 12/02/2018. General  appearance: alert, oriented, NAD Lungs: normal respiratory effort Heart: regular rate Abdomen: soft, non-tender; gravid, leopolds 3500 g Extremities: No calf swelling or tenderness Presentation: cephalic by SVE  Fetal monitoringBaseline: 135 bpm, Variability: Good {> 6 bpm), Accelerations: Reactive and Decelerations: Absent Uterine activity: irregular     Prenatal labs: ABO, Rh: A/Positive/-- (08/31 1642) Antibody: Negative (08/31 1642) Rubella: 5.31 (08/31 1642) RPR: Non Reactive (01/06 0917)  HBsAg: Negative (08/31 1642)  HIV: Non Reactive (01/06 0917)  GC/Chlamydia: neg/neg 08/18/2019  GBS: Negative/-- (03/08 1600)  2-hr GTT: normal 06/18/2019 (82,505,39) Genetic screening:  Low risk Panorama Anatomy US: R pyelectasis, otherwise normal  Prenatal Transfer Tool  Maternal Diabetes: No Genetic Screening: Normal Maternal Ultrasounds/Referrals: Fetal renal pyelectasis Fetal Ultrasounds or other Referrals:  None Maternal Substance Abuse:  No Significant Maternal Medications:  None Significant Maternal Lab Results: Group B Strep negative  No results found for this or any previous visit (from the past 24 hour(s)).  Patient Active Problem List   Diagnosis Date Noted  . Encounter for elective induction of labor 09/03/2019  . Excessive fetal growth affecting management of mother in third trimester, antepartum 08/12/2019  . History of episiotomy 08/12/2019  . 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 Pyelectasis of fetus on prenatal ultrasound 07/29/2019  . Fourth degree perineal laceration during delivery 10/05/2015  . Supervision of other normal pregnancy, antepartum 01/26/2015    Assessment: Kimberly Randolph is a 29 y.o. G2P1001 at 17w2dhere for elective IOL with hx of 4th degree perineal laceration.  #[facility] Cerv[facility] favorable, start pitocin 2x2 #Pain: IV pain meds PRN, epidural upon request #FWB: Cat I #GBS/ID:  neg #COVID: swab negative 09/01/2019 #MOF: Breast #MOC: IUD, outpatient #Circ: n/a   MClarnce Flock3/24/2021, 12:34 AM

## 2019-09-03 NOTE — Progress Notes (Signed)
Kimberly Randolph is a 29 y.o. G2P1001 at [redacted]w[redacted]d.  Subjective: Comfortable w/ epidrual.   Objective: BP 108/65   Pulse 95   Temp 98.4 F (36.9 C) (Oral)   Resp 16   Ht 5\' 9"  (1.753 m)   Wt 96.2 kg   LMP 12/02/2018   SpO2 97%   BMI 31.32 kg/m    FHT:  FHR: 140 bpm, variability: mod,  accelerations:  15x15,  decelerations:  none UC:   Q 2-4 minutes, strong Dilation: 7 Effacement (%): 90 Station: -1 Presentation: Vertex Exam by:: Kimberly Randolph, cnm  Labs: Results for orders placed or performed during the hospital encounter of 09/03/19 (from the past 24 hour(s))  CBC     Status: Abnormal   Collection Time: 09/03/19 12:33 AM  Result Value Ref Range   WBC 11.7 (H) 4.0 - 10.5 K/uL   RBC 4.05 3.87 - 5.11 MIL/uL   Hemoglobin 12.2 12.0 - 15.0 g/dL   HCT 09/05/19 (L) 95.6 - 21.3 %   MCV 87.9 80.0 - 100.0 fL   MCH 30.1 26.0 - 34.0 pg   MCHC 34.3 30.0 - 36.0 g/dL   RDW 08.6 57.8 - 46.9 %   Platelets 222 150 - 400 K/uL   nRBC 0.0 0.0 - 0.2 %  Type and screen     Status: None   Collection Time: 09/03/19 12:33 AM  Result Value Ref Range   ABO/RH(D) A POS    Antibody Screen NEG    Sample Expiration      09/06/2019,2359 Performed at [facility], [address] [place], [place]   ABO/Rh     Status: None   Collection Time: 09/03/19 12:33 AM  Result Value Ref Range   ABO/RH(D)      A POS Performed at [facility], [address] [place], [place]     Assessment / Plan: [redacted]w[redacted]d week IUP Labor: Transition Fetal Wellbeing:  Category I Pain Control:  Epidural Anticipated MOD:  SVD. Discussed Hx 4th degree. Plan passive second stage as much as possible. If pt not able to deliver spontaneously or if pushing becomes prolonged she requests C/S. Dr. [redacted]w[redacted]d aware.   Kimberly Randolph, Kimberly Randolph, Kimberly Randolph 09/03/2019 9:32 AM

## 2019-09-03 NOTE — Discharge Summary (Addendum)
Postpartum Discharge Summary    Patient Name: Kimberly Randolph DOB: Jan 16, 1991 MRN: [id number]  Date of admission: 09/03/2019 Delivering Provider: Manya Silvas   Date of discharge: 09/04/2019  Admitting diagnosis: Encounter for elective induction of labor [Z34.90] Intrauterine pregnancy: [redacted]w[redacted]d    Secondary diagnosis:  Active Problems:   Supervision of other normal pregnancy, antepartum   Fourth degree perineal laceration during delivery   Pyelectasis of fetus on prenatal ultrasound   Excessive fetal growth affecting management of mother in third trimester, antepartum   History of episiotomy   Depression during pregnancy in third trimester   Encounter for elective induction of labor  Additional problems: None     Discharge diagnosis: Term Pregnancy Delivered                                                                                                Post partum procedures:None  Augmentation: Pitocin  Complications: None  Hospital course:  Induction of Labor With Vaginal Delivery   29y.o. yo G2P1001 at 31w2das admitted to the hospital 09/03/2019 for induction of labor.  Indication for induction: TERM elective. She had a hx of a 4th deg lac after a VE delivery with MLE.  Patient had an uncomplicated labor course as follows:  Induction was initated with outpatient FB. When she arrived pitocin was started. She SROMed, and progressed to complete, delivering shortly after.  Membrane Rupture Time/Date: 9:44 AM ,09/03/2019   Intrapartum Procedures: Episiotomy: None [1]                                         Lacerations:  2nd degree [3]  Patient had delivery of a Viable infant.  Information for the patient's newborn:  ThBenelli, Wintherirl KaSheketa0[[id number]]Delivery Method: Vaginal, Spontaneous(Filed from Delivery Summary)    09/03/2019  Details of delivery can be found in separate delivery note.  Patient had a routine postpartum course. Patient is discharged home 09/04/19. Delivery time:  12:08 PM    Magnesium Sulfate received: No BMZ received: No Rhophylac:N/A MMR:N/A Transfusion:No  Physical exam  Vitals:   09/03/19 1530 09/03/19 1907 09/03/19 2300 09/04/19 0500  BP: 103/63 108/75 109/62 115/79  Pulse: 64 77 69 60  Resp: 18 20 18 16   Temp: 97.9 F (36.6 C) 98.6 F (37 C) 98.5 F (36.9 C) 98.4 F (36.9 C)  TempSrc: Oral Oral Oral Oral  SpO2: 100%  99% 99%  Weight:      Height:       General: alert, cooperative and no distress Lochia: appropriate Uterine Fundus: firm Incision: N/A DVT Evaluation: No evidence of DVT seen on physical exam. Labs: Lab Results  Component Value Date   WBC 12.2 (H) 09/04/2019   HGB 10.6 (L) 09/04/2019   HCT 31.4 (L) 09/04/2019   MCV 90.0 09/04/2019   PLT 190 09/04/2019   CMP Latest Ref Rng & Units 02/10/2019  Glucose 65 - 99 mg/dL 87  BUN 6 - 20 mg/dL 8  Creatinine 0.57 -  1.00 mg/dL 0.61  Sodium 134 - 144 mmol/L 136  Potassium 3.5 - 5.2 mmol/L 3.9  Chloride 96 - 106 mmol/L 102  CO2 20 - 29 mmol/L 19(L)  Calcium 8.7 - 10.2 mg/dL 9.2  Total Protein 6.0 - 8.5 g/dL 7.2  Total Bilirubin 0.0 - 1.2 mg/dL 0.4  Alkaline Phos 39 - 117 IU/L 45  AST 0 - 40 IU/L 18  ALT 0 - 32 IU/L 10   Edinburgh Score: No flowsheet data found.  Discharge instruction: per After Visit Summary and "Baby and Me Booklet".  After visit meds:  Allergies as of 09/04/2019      Reactions   Penicillins Rash   Has patient had a PCN reaction causing immediate rash, facial/tongue/throat swelling, SOB or lightheadedness with hypotension: unknown Has patient had a PCN reaction causing severe rash involving mucus membranes or skin necrosis: unknown Has patient had a PCN reaction that required hospitalization unknown Has patient had a PCN reaction occurring within the last 10 years: no If all of the above answers are "NO", then may proceed with Cephalosporin use.      Medication List    STOP taking these medications   Blood Pressure Kit     TAKE  these medications   acetaminophen 325 MG tablet Commonly known as: Tylenol Take 2 tablets (650 mg total) by mouth every 4 (four) hours as needed for mild pain (for pain scale < 4).   ibuprofen 600 MG tablet Commonly known as: ADVIL Take 1 tablet (600 mg total) by mouth every 6 (six) hours.   Medela Double Breast Pump Misc 1 Units by Does not apply route as needed.   PRENATAL VITAMINS PO Take by mouth.   ZYRTEC PO Take by mouth.       Diet: routine diet  Activity: Advance as tolerated. Pelvic rest for 6 weeks.   Outpatient follow up:6 weeks Follow up Appt: Future Appointments  Date Time Provider [place]  10/09/2019 10:30 AM Aletha Halim, MD [facility]   Follow up Visit: Please schedule this patient for Postpartum visit in: 4-6 weeks with the following provider: Any provider In-person For C/S patients schedule nurse incision check in weeks 2 weeks: no Low risk pregnancy complicated by: h/o 4th degree tear Delivery mode:  SVD Anticipated Birth Control:  IUD at outpatient visit PP Procedures needed: postpartum IUD  Schedule Integrated BH visit: no  Newborn Data: Live born female  Birth Weight: 4047g  APGAR: 58, 9  Newborn Delivery   Birth date/time: 09/03/2019 12:08:00 Delivery type: Vaginal, Spontaneous      Baby Feeding: Breast Disposition:home with mother   09/04/2019 Lurline Del, DO  CNM attestation I have seen and examined this patient and agree with above documentation in the resident's note.   Kimberly Randolph is a 29 y.o. G2P2002 s/p vag del.   Pain is well controlled.  Plan for birth control is IUD.  Method of Feeding: breast  PE:  BP 115/79 (BP Location: Left Arm)   Pulse 60   Temp 98.4 F (36.9 C) (Oral)   Resp 16   Ht 5' 9"  (1.753 m)   Wt 96.2 kg   LMP 12/02/2018   SpO2 99%   Breastfeeding Unknown   BMI 31.32 kg/m  Fundus firm  Recent Labs    09/03/19 0033 09/04/19 0522  HGB 12.2 10.6*  HCT 35.6* 31.4*      Plan: discharge today - postpartum care discussed - f/u clinic in 4-6 weeks for postpartum visit  Myrtis Ser, CNM 12:53 PM

## 2019-09-03 NOTE — Lactation Note (Signed)
This note was copied from a baby's chart. Lactation Consultation Note  Patient Name: Kimberly Randolph Today's Date: 09/03/2019 Reason for consult: Initial assessment;Term;1st time breastfeeding  P2 mother whose infant is now 58 hours old.  This is a term baby at 39+2 weeks.  Mother attempted to breast feed her first child (now 31 years old) but was not successful.  At the first pediatrician visit after discharge baby had lost too much weight and mother was told to supplement.  After supplementation was started she was not able to get him to latch and breast feed.  Baby was asleep in mother's arms when I arrived.  She had no immediate questions/concerns related to breast feeding.  Baby latched after delivery but has since been sleepy.  Reassured mother that this is typical behavior for a baby at this age.  Reviewed feeding cues and asked mother to feed on cue or at least 8-12 times/24 hours.  Reminded her to feed STS and to continue doing hand expression before/after feeding to help increase milk supply.  Colostrum container provided and milk storage times reviewed.  Finger feeding demonstrated.  Mom made aware of O/P services, breastfeeding support groups, community resources, and our phone # for post-discharge questions.  Mother has a DEBP for home use.  Suggested she call her RN/LC for latch assistance as needed.  Father present.   Maternal Data Formula Feeding for Exclusion: No Has patient been taught Hand Expression?: Yes Does the patient have breastfeeding experience prior to this delivery?: No(Attempted in the hospital but was not successful)  Feeding Feeding Type: Breast Fed  LATCH Score Latch: Repeated attempts needed to sustain latch, nipple held in mouth throughout feeding, stimulation needed to elicit sucking reflex.  Audible Swallowing: A few with stimulation  Type of Nipple: Everted at rest and after stimulation  Comfort (Breast/Nipple): Soft / non-tender  Hold (Positioning):  Assistance needed to correctly position infant at breast and maintain latch.  LATCH Score: 7  Interventions Interventions: Breast feeding basics reviewed;Assisted with latch;Skin to skin;Breast massage;Hand express;Support pillows;Position options  Lactation Tools Discussed/Used     Consult Status Consult Status: Follow-up Date: 09/04/19 Follow-up type: In-patient    Dora Sims 09/03/2019, 5:26 PM

## 2019-09-03 NOTE — Anesthesia Preprocedure Evaluation (Signed)
Anesthesia Evaluation  Patient identified by MRN, date of birth, ID band Patient awake    Reviewed: Allergy & Precautions, NPO status , Patient's Chart, lab work & pertinent test results  Airway Mallampati: II  TM Distance: >3 FB Neck ROM: Full    Dental no notable dental hx.    Pulmonary neg pulmonary ROS,    Pulmonary exam normal breath sounds clear to auscultation       Cardiovascular negative cardio ROS Normal cardiovascular exam Rhythm:Regular Rate:Normal     Neuro/Psych negative neurological ROS  negative psych ROS   GI/Hepatic negative GI ROS, Neg liver ROS,   Endo/Other  negative endocrine ROS  Renal/GU negative Renal ROS  negative genitourinary   Musculoskeletal negative musculoskeletal ROS (+)   Abdominal   Peds  Hematology negative hematology ROS (+)   Anesthesia Other Findings   Reproductive/Obstetrics                             Anesthesia Physical Anesthesia Plan  ASA: II  Anesthesia Plan: Epidural   Post-op Pain Management:    Induction:   PONV Risk Score and Plan: Treatment may vary due to age or medical condition  Airway Management Planned: Natural Airway  Additional Equipment:   Intra-op Plan:   Post-operative Plan:   Informed Consent: I have reviewed the patients History and Physical, chart, labs and discussed the procedure including the risks, benefits and alternatives for the proposed anesthesia with the patient or authorized representative who has indicated his/her understanding and acceptance.       Plan Discussed with: Anesthesiologist  Anesthesia Plan Comments: (Patient identified. Risks, benefits, options discussed with patient including but not limited to bleeding, infection, nerve damage, paralysis, failed block, incomplete pain control, headache, blood pressure changes, nausea, vomiting, reactions to medication, itching, and post partum back  pain. Confirmed with bedside nurse the patient's most recent platelet count. Confirmed with the patient that they are not taking any anticoagulation, have any bleeding history or any family history of bleeding disorders. Patient expressed understanding and wishes to proceed. All questions were answered. )        Anesthesia Quick Evaluation

## 2019-09-03 NOTE — Progress Notes (Signed)
LABOR PROGRESS NOTE  Kimberly Randolph is a 29 y.o. G2P1001 at [redacted]w[redacted]d  admitted for IOL 2/2 hx of fourth degree laceration.  Subjective: Starting to feel some contractions but not very painful Pit at 8u  Objective: BP 97/66   Pulse 81   Temp 98.2 F (36.8 C) (Oral)   Resp 18   Ht 5\' 9"  (1.753 m)   Wt 96.2 kg   LMP 12/02/2018   BMI 31.32 kg/m  or  Vitals:   09/03/19 0402 09/03/19 0500 09/03/19 0600 09/03/19 0701  BP: (!) 86/49 (!) 92/53 (!) 94/58 97/66  Pulse: 73 69 (!) 59 81  Resp: 18 18 17 18   Temp:      TempSrc:      Weight:      Height:         Dilation: 4.5 Effacement (%): 70 Station: -3 Presentation: Vertex Exam by:: Dr. FHT: baseline rate 135, moderate varibility, +acel, -decel Toco: poor tracing, pit at [phone]: Lab Results  Component Value Date   WBC 11.7 (H) 09/03/2019   HGB 12.2 09/03/2019   HCT 35.6 (L) 09/03/2019   MCV 87.9 09/03/2019   PLT 222 09/03/2019    Patient Active Problem List   Diagnosis Date Noted  . Encounter for elective induction of labor 09/03/2019  . Excessive fetal growth affecting management of mother in third trimester, antepartum 08/12/2019  . History of episiotomy 08/12/2019  . 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 Pyelectasis of fetus on prenatal ultrasound 07/29/2019  . Fourth degree perineal laceration during delivery 10/05/2015  . Supervision of other normal pregnancy, antepartum 01/26/2015    Assessment / Plan: 29 y.o. G2P1001 at [redacted]w[redacted]d here for IOL at term 2/2 hx of 4th degree laceration.  Labor: favorable cervix on admission, started on pitocin. Some more descent but still high and not yet feeling contractions very strongly. Cont to uptitrate pitocin and reassess for AROM in 2-3 hours.  Fetal Wellbeing:  Cat I Pain Control:  IV pain meds PRN, desires epidural GBS: neg Anticipated MOD:  SVD   38, MD/[facility] OB Fellow  09/03/2019, 7:07 AM

## 2019-09-03 NOTE — Anesthesia Procedure Notes (Signed)
Epidural Patient location during procedure: OB Start time: 09/03/2019 8:30 AM End time: 09/03/2019 8:45 AM  Staffing Anesthesiologist: Elmer Picker, MD Performed: anesthesiologist   Preanesthetic Checklist Completed: patient identified, IV checked, risks and benefits discussed, monitors and equipment checked, pre-op evaluation and timeout performed  Epidural Patient position: sitting Prep: DuraPrep and site prepped and draped Patient monitoring: continuous pulse ox, blood pressure, heart rate and cardiac monitor Approach: midline Location: L3-L4 Injection technique: LOR air  Needle:  Needle type: Tuohy  Needle gauge: 17 G Needle length: 9 cm Needle insertion depth: 6 cm Catheter type: closed end flexible Catheter size: 19 Gauge Catheter at skin depth: 12 cm Test dose: negative  Assessment Sensory level: T8 Events: blood not aspirated, injection not painful, no injection resistance, no paresthesia and negative IV test  Additional Notes Patient identified. Risks/Benefits/Options discussed with patient including but not limited to bleeding, infection, nerve damage, paralysis, failed block, incomplete pain control, headache, blood pressure changes, nausea, vomiting, reactions to medication both or allergic, itching and postpartum back pain. Confirmed with bedside nurse the patient's most recent platelet count. Confirmed with patient that they are not currently taking any anticoagulation, have any bleeding history or any family history of bleeding disorders. Patient expressed understanding and wished to proceed. All questions were answered. Sterile technique was used throughout the entire procedure. Please see nursing notes for vital signs. Test dose was given through epidural catheter and negative prior to continuing to dose epidural or start infusion. Warning signs of high block given to the patient including shortness of breath, tingling/numbness in hands, complete motor block,  or any concerning symptoms with instructions to call for help. Patient was given instructions on fall risk and not to get out of bed. All questions and concerns addressed with instructions to call with any issues or inadequate analgesia.  Reason for block:procedure for pain

## 2019-09-03 NOTE — Anesthesia Postprocedure Evaluation (Signed)
Anesthesia Post Note  Patient: Patent attorney  Procedure(s) Performed: AN AD HOC LABOR EPIDURAL     Patient location during evaluation: Mother Baby Anesthesia Type: Epidural Level of consciousness: awake Pain management: satisfactory to patient Vital Signs Assessment: post-procedure vital signs reviewed and stable Respiratory status: spontaneous breathing Cardiovascular status: stable Anesthetic complications: no    Last Vitals:  Vitals:   09/03/19 1410 09/03/19 1530  BP: 118/74 103/63  Pulse: 86 64  Resp: 20 18  Temp: (!) 38 C 36.6 C  SpO2: 99% 100%    Last Pain:  Vitals:   09/03/19 1616  TempSrc:   PainSc: 4    Pain Goal: Patients Stated Pain Goal: 4 (09/03/19 1616)                 Cephus Shelling

## 2019-09-03 NOTE — Discharge Instructions (Signed)

## 2019-09-04 LAB — CBC
HCT: 31.4 % — ABNORMAL LOW (ref 36.0–46.0)
Hemoglobin: 10.6 g/dL — ABNORMAL LOW (ref 12.0–15.0)
MCH: 30.4 pg (ref 26.0–34.0)
MCHC: 33.8 g/dL (ref 30.0–36.0)
MCV: 90 fL (ref 80.0–100.0)
Platelets: 190 10*3/uL (ref 150–400)
RBC: 3.49 MIL/uL — ABNORMAL LOW (ref 3.87–5.11)
RDW: 13.1 % (ref 11.5–15.5)
WBC: 12.2 10*3/uL — ABNORMAL HIGH (ref 4.0–10.5)
nRBC: 0 % (ref 0.0–0.2)

## 2019-09-04 MED ORDER — ACETAMINOPHEN 325 MG PO TABS: 650.0000 mg | ORAL_TABLET | ORAL | 0 refills | Status: AC | PRN

## 2019-09-04 MED ORDER — IBUPROFEN 600 MG PO TABS: 600.0000 mg | ORAL_TABLET | Freq: Four times a day (QID) | ORAL | 0 refills | Status: AC

## 2019-09-04 NOTE — Lactation Note (Addendum)
This note was copied from a baby's chart. Lactation Consultation Note Baby 14 hrs old. Baby very fussy crying a lot. Mom exhausted. Baby not satisfied at the breast. Will pop off crying. LC placed baby in football position latched well. Encouraged mom cheeks to breat. Mom has everted nipples w/bulbous areolas, wide spaced breast.Hand expression w/colostrum noted. Baby fed well 15 minutes then sound asleep.  LC swaddled placed in basinet, baby wakes up. Cueing, then cries. Mom stated she has to rest, FOB has to hold her.  Discussed cluster feeding, feeding habits, newborn behavior, milk storage, supply and demand. Since mom has hx: low milk supply LC set up DEBP. Mom agrees to pump but want to wait until the morning after she rest. Mom shown how to use DEBP & how to disassemble, clean, & reassemble parts. Mom knows to pump q3h for 15-20 min. LC hand expressed colostrum. Praised mom. Asked mom if she wants to hand express some colostrum and spoon feed it to the baby. Baby stated she was exhausted, baby just ate. FOB took baby walked in rm. W/her. Discussed monitoring output, satisfaction after BF, and wt. Loss and output will determine supplementing. Mom in agreement.  Reported to RN.  Patient Name: Kimberly Randolph [id number] Date: 09/04/2019 Reason for consult: Follow-up assessment;Term   Maternal Data Has patient been taught Hand Expression?: Yes Does the patient have breastfeeding experience prior to this delivery?: Yes  Feeding Feeding Type: Breast Fed  LATCH Score Latch: Grasps breast easily, tongue down, lips flanged, rhythmical sucking.  Audible Swallowing: None  Type of Nipple: Everted at rest and after stimulation  Comfort (Breast/Nipple): Soft / non-tender  Hold (Positioning): Assistance needed to correctly position infant at breast and maintain latch.  LATCH Score: 7  Interventions Interventions: Breast feeding basics reviewed;Support pillows;Assisted with  latch;Position options;Skin to skin;Breast massage;Hand express;Breast compression;Adjust position;DEBP  Lactation Tools Discussed/Used Tools: Pump Breast pump type: Double-Electric Breast Pump WIC Program: No Pump Review: Setup, frequency, and cleaning;Milk Storage Initiated by:: L.Tamra Koos RN IBCLC Date initiated:: 09/04/19   Consult Status Consult Status: Follow-up Date: 09/04/19 Follow-up type: In-patient    [name] 09/04/2019, 2:34 AM

## 2019-09-04 NOTE — Progress Notes (Signed)
CSW received consult for history of depression. CSW met with MOB to offer support and complete assessment.    MOB sitting on the couch breastfeeding infant with FOB present at bedside, when CSW entered the room. CSW introduced self and received verbal permission to complete assessment with FOB present. MOB and FOB welcoming of CSW visit and were pleasant throughout visit. 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 Per MOB, she was started on Zoloft at the time and felt it was effective in managing her symptoms. MOB reported she would like to see how things go this time before restarting medications but is aware she can reach out to her OBGYN in the event she feels she needs them. MOB reported feeling good right now. CSW provided education regarding the baby blues period vs. perinatal mood disorders. CSW recommended self-evaluation during the postpartum time period using the New Mom Checklist from Postpartum Progress and encouraged MOB to contact a medical professional if symptoms are noted at any time. MOB did not appear to be displaying any acute mental health symptoms and denied any current SI or HI. MOB reported feeling well-supported by FOB, her brother and her sister.  MOB confirmed having all essential items for infant once discharged and stated infant would be sleeping in a bassinet once home. CSW provided review of Sudden Infant Death Syndrome (SIDS) precautions and safe sleeping habits.    CSW identifies no further need for intervention and no barriers to discharge at this time.  Elijio Miles, LCSW [facility] [employer] [phone]

## 2019-10-09 ENCOUNTER — Ambulatory Visit: Payer: BC Managed Care – PPO | Admitting: Obstetrics and Gynecology

## 2019-10-13 ENCOUNTER — Ambulatory Visit (INDEPENDENT_AMBULATORY_CARE_PROVIDER_SITE_OTHER): Payer: BC Managed Care – PPO | Admitting: Obstetrics and Gynecology

## 2019-10-13 ENCOUNTER — Other Ambulatory Visit: Payer: Self-pay

## 2019-10-13 VITALS — BP 109/62 | HR 70 | Wt 195.0 lb

## 2019-10-13 DIAGNOSIS — Z30014 Encounter for initial prescription of intrauterine contraceptive device: Secondary | ICD-10-CM

## 2019-10-13 DIAGNOSIS — Z3202 Encounter for pregnancy test, result negative: Secondary | ICD-10-CM

## 2019-10-13 DIAGNOSIS — Z975 Presence of (intrauterine) contraceptive device: Secondary | ICD-10-CM

## 2019-10-13 DIAGNOSIS — Z30430 Encounter for insertion of intrauterine contraceptive device: Secondary | ICD-10-CM

## 2019-10-13 HISTORY — PX: IUD INSERTION: OBO1003

## 2019-10-13 LAB — POCT URINE PREGNANCY: Preg Test, Ur: NEGATIVE

## 2019-10-13 MED ORDER — LEVONORGESTREL 20 MCG/24HR IU IUD: INTRAUTERINE_SYSTEM | Freq: Once | INTRAUTERINE | Status: AC

## 2019-10-13 NOTE — Progress Notes (Signed)
    Post Partum Visit Note  Kimberly Randolph is a 29 y.o. G2P2002 who is s/p 3/24 SVD/2nd degree at 39wks after IOL for h/o 4th degree. Patient was discharged on PPD#1.   I have fully reviewed the prenatal and intrapartum course. The delivery was at 39.2 gestational weeks.  Anesthesia: epidural. Postpartum course has been uncomplicated. Baby is doing well. Baby is feeding by breast/bottle. Bleeding no bleeding. Bowel function is normal. Bladder function is normal. Patient is not sexually active. Contraception method is IUD. Postpartum depression screening: negative.  Review of Systems Pertinent items noted in HPI and remainder of comprehensive ROS otherwise negative.    Objective:  Blood pressure 109/62, pulse 70, weight 195 lb (88.5 kg), unknown if currently breastfeeding.  NAD  Assessment:    Normal postpartum exam.   Plan:    1.  Mood and well being: doing well. EPDS score 5  2. Infant care and feeding: breast and formula feeding  3. Sexuality, contraception and birth spacing: no sexual intercourse yet. Mirena inserted w/o difficulty today  4. Sleep and fatigue  5. Physical Recovery: healing well. No bowel or bladder issues  6.  Health Maintenance: pap smear UTD   F[place] Bing, MD [facility], [facility]

## 2019-10-13 NOTE — Procedures (Signed)
Intrauterine Device (IUD) Insertion Procedure Note  After a recent pap (results: negative/date: 2020) and recent negative gonorrhea-chlamydia testing was confirmed, written consent was obtained; her urine pregnancy test was: negative The patient understands the risks of IUD placement, which include but are not limited to: bleeding, infection, uterine perforation, risk of expulsion, risk of failure < 1%, increased risk of ectopic pregnancy in the event of failure.   Prior to the procedure being performed, the patient (or guardian) was asked to state their full name, date of birth, and the type of procedure being performed. A bimanual exam showed the uterus to be midposition.  Next, the cervix and vagina were cleaned with an antiseptic solution, and the cervix was grasped with a tenaculum.  The uterus was sounded to 7 cm.  The Mirena was placed without difficulty in the usual fashion.  The strings were cut to 3-4 cm.  The tenaculum was removed and cervix was found to be hemostatic.    No complications, patient tolerated the procedure well.  Cornelia Copa MD Attending [facility] [employer] [facility])

## 2019-10-14 ENCOUNTER — Encounter: Payer: Self-pay | Admitting: Obstetrics and Gynecology

## 2019-10-14 DIAGNOSIS — Z975 Presence of (intrauterine) contraceptive device: Secondary | ICD-10-CM | POA: Insufficient documentation

## 2019-11-13 ENCOUNTER — Encounter: Payer: Self-pay | Admitting: Obstetrics and Gynecology

## 2019-11-13 ENCOUNTER — Ambulatory Visit (INDEPENDENT_AMBULATORY_CARE_PROVIDER_SITE_OTHER): Payer: BC Managed Care – PPO | Admitting: Obstetrics and Gynecology

## 2019-11-13 ENCOUNTER — Other Ambulatory Visit: Payer: Self-pay

## 2019-11-13 VITALS — BP 95/62 | HR 79

## 2019-11-13 DIAGNOSIS — Z30431 Encounter for routine checking of intrauterine contraceptive device: Secondary | ICD-10-CM | POA: Diagnosis not present

## 2019-11-13 NOTE — Progress Notes (Signed)
Obstetrics and Gynecology Visit Return Patient Evaluation  Appointment Date: 11/13/2019  Primary Care Provider: Selina Cooley  OBGYN Clinic: [facility]  Chief Complaint: regular string check  History of Present Illness:  Kimberly Randolph is a 29 y.o. s/p 5/3 Mirena insertion at her PP visit.   Interval History: Since that time, she states that she has not had any problems or issues; she has had intercourse since insertion  Review of Systems: as noted in the History of Present Illness.  Medications:  Jammy C. Backhaus had no medications administered during this visit. Current Outpatient Medications  Medication Sig Dispense Refill  . levonorgestrel (MIRENA) 20 MCG/24HR IUD 1 each by Intrauterine route once.    Marland Kitchen acetaminophen (TYLENOL) 325 MG tablet Take 2 tablets (650 mg total) by mouth every 4 (four) hours as needed for mild pain (for pain scale < 4). 30 tablet 0  . Cetirizine HCl (ZYRTEC PO) Take by mouth.    Marland Kitchen ibuprofen (ADVIL) 600 MG tablet Take 1 tablet (600 mg total) by mouth every 6 (six) hours. 30 tablet 0  . Misc. Devices (MEDELA DOUBLE BREAST PUMP) MISC 1 Units by Does not apply route as needed. (Patient not taking: Reported on 08/28/2019) 1 each 0  . Prenatal Vit-Fe Fumarate-FA (PRENATAL VITAMINS PO) Take by mouth.     No current facility-administered medications for this visit.    Allergies: is allergic to penicillins.  Physical Exam:  BP 95/62   Pulse 79  There is no height or weight on file to calculate BMI. General appearance: Well nourished, well developed female in no acute distress.  Abdomen: diffusely non tender to palpation, non distended, and no masses, hernias Neuro/Psych:  Normal mood and affect.    Pelvic exam:  EGBUS, vaginal vault and cervix: within normal limits IUD strings 3-4cm tucked in to fornices   Assessment: pt doing well  Plan: Routine care  RTC: PRN  Cornelia Copa MD Attending Center for  [facility])

## 2020-06-21 ENCOUNTER — Other Ambulatory Visit: Payer: Self-pay

## 2020-06-21 DIAGNOSIS — Z20822 Contact with and (suspected) exposure to covid-19: Secondary | ICD-10-CM

## 2020-06-24 LAB — NOVEL CORONAVIRUS, NAA: SARS-CoV-2, NAA: DETECTED — AB

## 2021-06-24 IMAGING — US US MFM OB COMP +14 WKS
1 series · 13 of 28 positions shown · non-contrast
Comparison: none

[Series 1: us mfm ob comp +14 wks · 94 acquisitions, 13 frames shown]
[im 4/94]
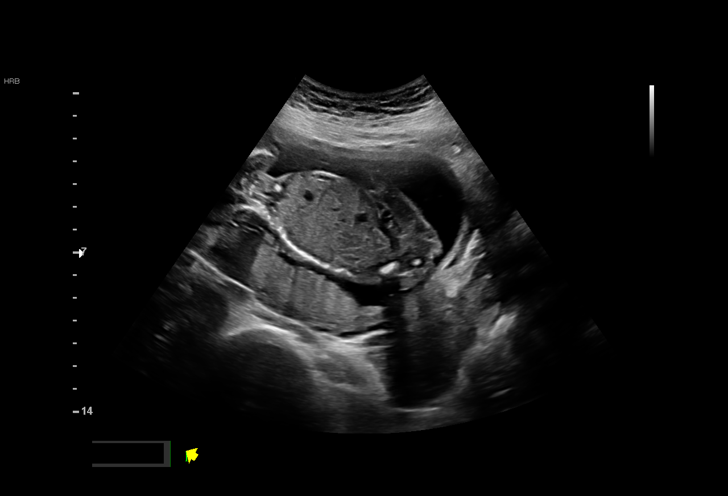
[im 11/94]
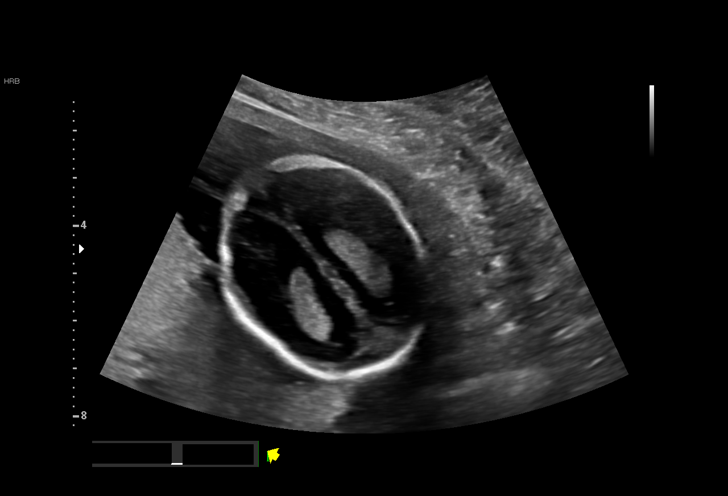
[im 18/94]
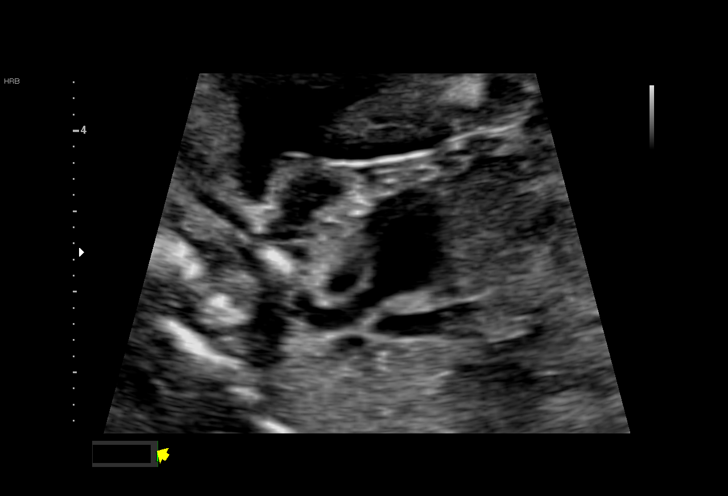
[im 25/94]
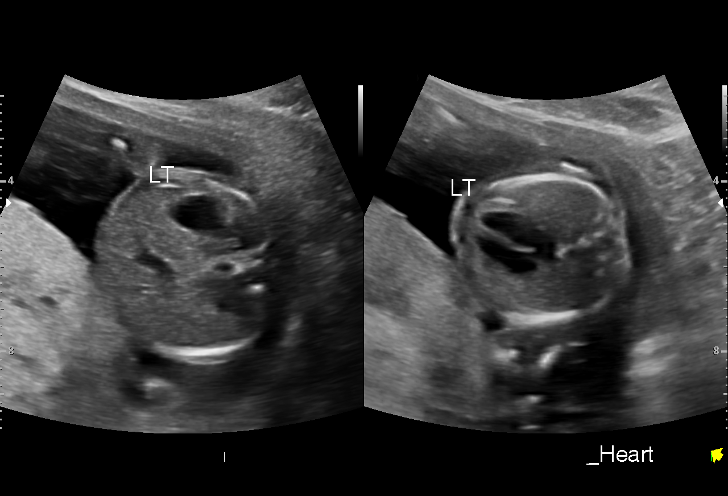
[im 32/94]
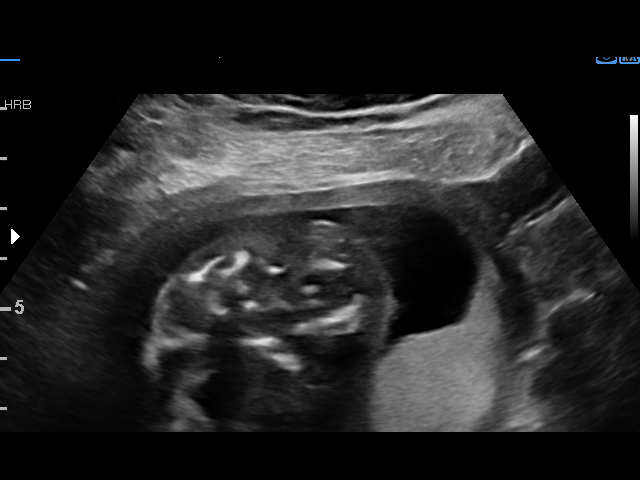
[im 38/94]
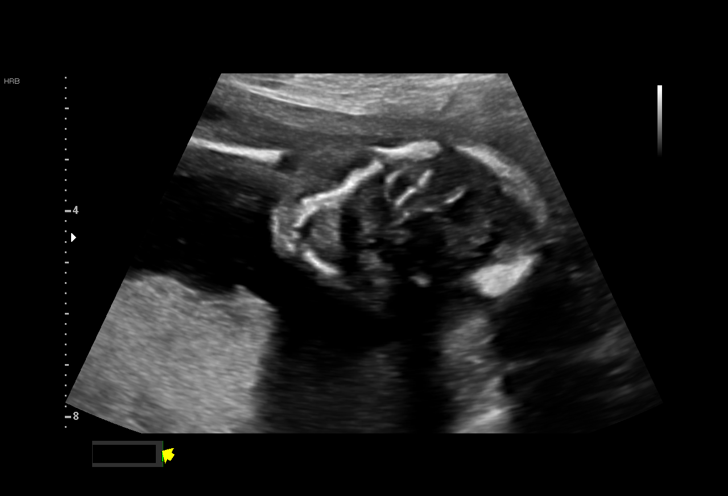
[im 49/94]
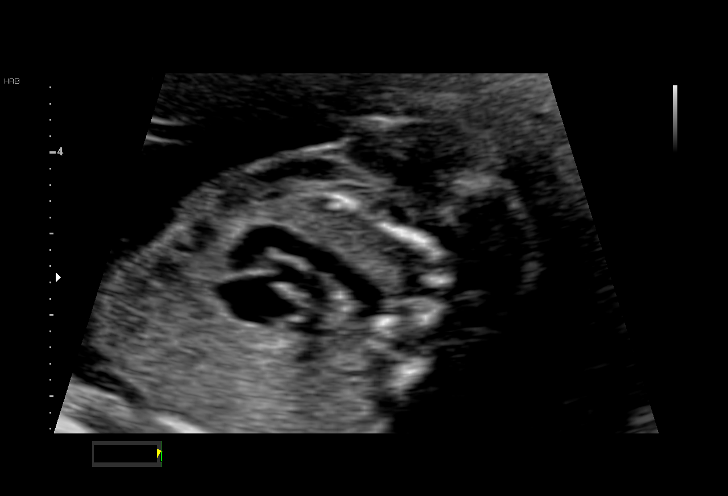
[im 56/94]
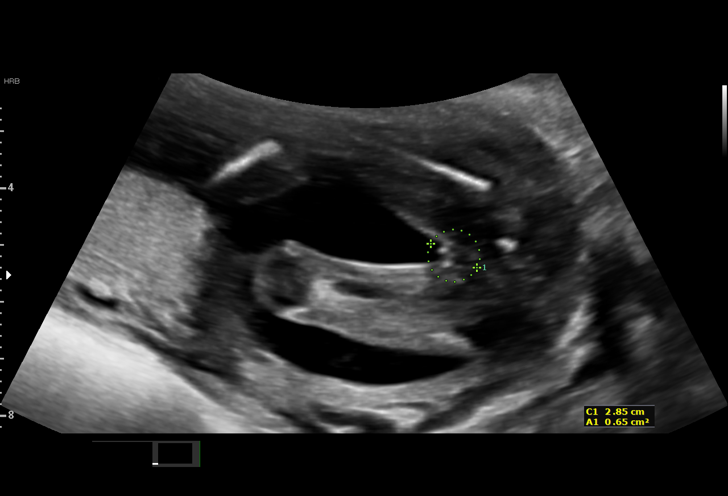
[im 63/94]
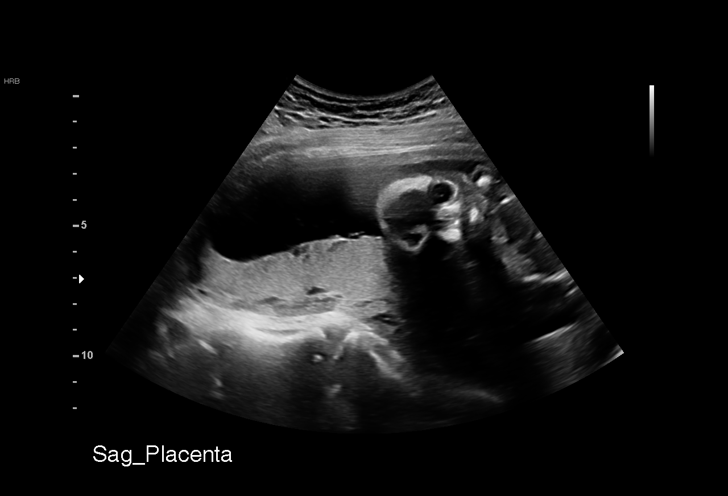
[im 69/94]
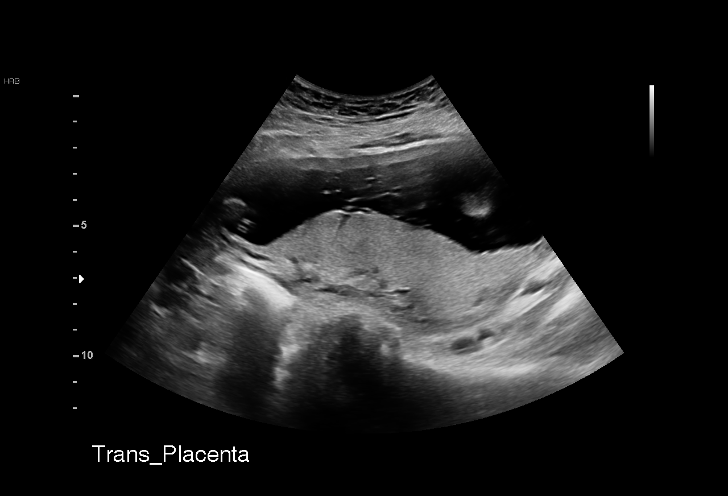
[im 76/94]
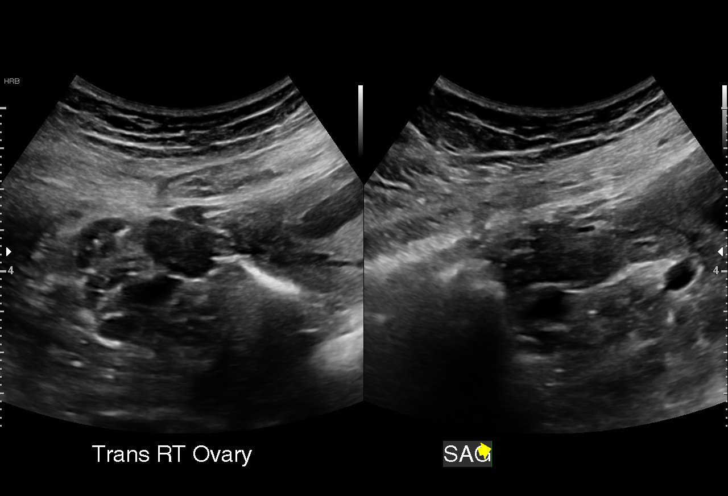
[im 83/94]
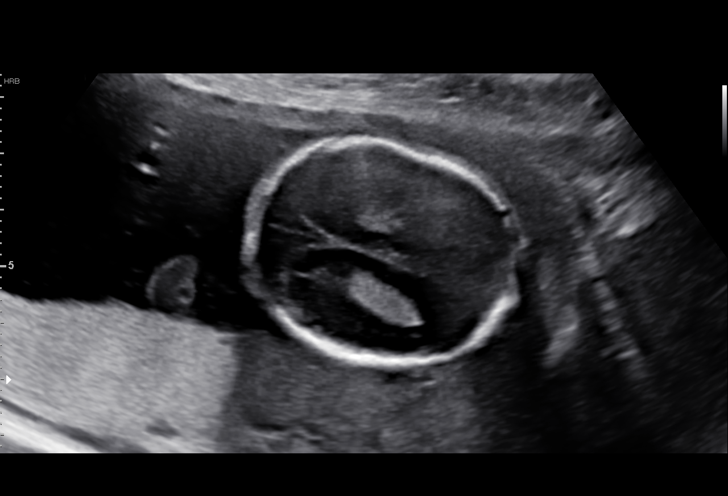
[im 90/94]
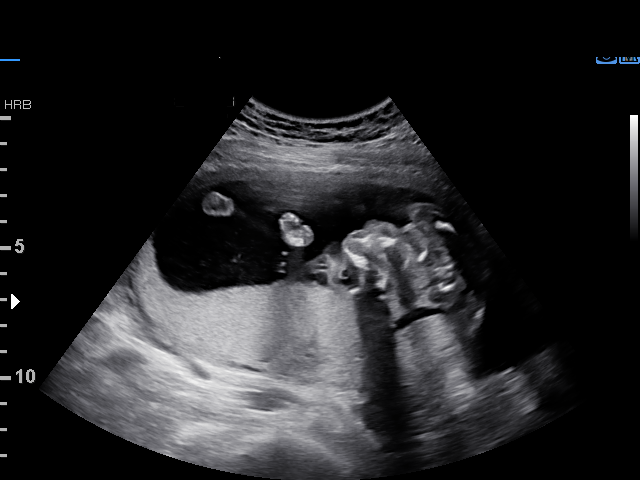

[13 of 28 positions shown; findings below may reference images not displayed]

1  US MFM OB COMP + 14 WK               76805.01     JIM CARLOS FRANCISCO MOYANO
 ----------------------------------------------------------------------

 ----------------------------------------------------------------------
Indications

  Fetal abnormality - other known or suspected
  Encounter for antenatal screening for
  malformations (low risk Panorama)
  18 weeks gestation of pregnancy
 ----------------------------------------------------------------------
Vital Signs

 BMI:
Fetal Evaluation

 Num Of Fetuses:         1
 Fetal Heart Rate(bpm):  158
 Cardiac Activity:       Observed
 Presentation:           Breech
 Placenta:               Posterior
 P. Cord Insertion:      Visualized, central

 Amniotic Fluid
 AFI FV:      Within normal limits

                             Largest Pocket(cm)

Biometry

 BPD:      39.1  mm     G. Age:  17w 6d         39  %    CI:        69.35   %    70 - 86
                                                         FL/HC:      18.0   %    15.8 - 18
 HC:      149.9  mm     G. Age:  18w 0d         38  %    HC/AC:      1.02        1.07 -
 AC:      146.3  mm     G. Age:  19w 6d         93  %    FL/BPD:     69.1   %
 FL:         27  mm     G. Age:  18w 2d         48  %    FL/AC:      18.5   %    20 - 24
 CER:      19.2  mm     G. Age:  18w 4d         64  %
 NFT:         5  mm

 LV:        6.4  mm
 CM:        4.4  mm

 Est. FW:     268  gm      0 lb 9 oz     91  %
OB History

 Gravidity:    2         Term:   1
 Living:       1
Gestational Age

 LMP:           18w 1d        Date:  12/02/18                 EDD:   09/08/19
 U/S Today:     18w 4d                                        EDD:   09/05/19
 Best:          18w 1d     Det. By:  LMP  (12/02/18)          EDD:   09/08/19
Anatomy

 Cranium:               Appears normal         Aortic Arch:            Not well visualized
 Cavum:                 Appears normal         Ductal Arch:            Not well visualized
 Ventricles:            Appears normal         Diaphragm:              Appears normal
 Choroid Plexus:        Appears normal         Stomach:                Appears normal, left
                                                                       sided
 Cerebellum:            Appears normal         Abdomen:                Appears normal
 Posterior Fossa:       Appears normal         Abdominal Wall:         Appears nml (cord
                                                                       insert, abd wall)
 Nuchal Fold:           Appears normal         Cord Vessels:           Appears normal (3
                                                                       vessel cord)
 Face:                  Appears normal         Kidneys:                Appear normal
                        (orbits and profile)
 Lips:                  Appears normal         Bladder:                Appears normal
 Thoracic:              Appears normal         Spine:                  Appears normal
 Heart:                 Appears normal         Upper Extremities:      Appears normal
                        (4CH, axis, and
                        situs)
 RVOT:                  Appears normal         Lower Extremities:      Appears normal
 LVOT:                  Appears normal

 Other:  Female gender Heels and 5th digit visualized. Nasal bone visualized.
Cervix Uterus Adnexa

 Cervix
 Length:           4.83  cm.
 Normal appearance by transabdominal scan.

 Uterus
 No abnormality visualized.

 Left Ovary
 Within normal limits.

 Right Ovary
 Within normal limits.

 Cul De Sac
 No free fluid seen.
 Adnexa
 No abnormality visualized.
Comments

 This patient was seen for a detailed fetal anatomy scan.  She
 denies any significant past medical history and denies any
 problems in her current pregnancy.
 She had a cell free DNA test earlier in her pregnancy which
 indicated a low risk for trisomy 21, 18, and 13. A female fetus
 is predicted.
 She was informed that the fetal growth and amniotic fluid
 level were appropriate for her gestational age.
 There were no obvious fetal anomalies noted on today's
 ultrasound exam.  However due to the fetal position, the fetal
 cardiac views were suboptimal today.
 The patient was informed that anomalies may be missed due
 to technical limitations. If the fetus is in a suboptimal position
 or maternal habitus is increased, visualization of the fetus in
 the maternal uterus may be impaired.
 A follow-up exam was scheduled in 4 weeks to obtain better
 cardiac views.

## 2022-02-09 ENCOUNTER — Ambulatory Visit: Payer: Self-pay | Admitting: Obstetrics and Gynecology

## 2022-04-06 ENCOUNTER — Ambulatory Visit: Payer: Self-pay | Admitting: Obstetrics and Gynecology

## 2022-06-01 ENCOUNTER — Ambulatory Visit: Payer: Self-pay | Admitting: Obstetrics and Gynecology

## 2023-04-25 ENCOUNTER — Ambulatory Visit
Admission: EM | Admit: 2023-04-25 | Discharge: 2023-04-25 | Disposition: A | Discharge status: Home / Self Care | Payer: BC Managed Care – PPO | Attending: Physician Assistant | Admitting: Physician Assistant

## 2023-04-25 DIAGNOSIS — F419 Anxiety disorder, unspecified: Secondary | ICD-10-CM

## 2023-04-25 DIAGNOSIS — R42 Dizziness and giddiness: Secondary | ICD-10-CM | POA: Diagnosis not present

## 2023-04-25 DIAGNOSIS — R0689 Other abnormalities of breathing: Secondary | ICD-10-CM | POA: Diagnosis not present

## 2023-04-25 NOTE — Discharge Instructions (Addendum)
-  Your EKG looks perfect. - Your vital signs are all normal.  On exam your heart rhythm is regular and at a normal rate.  You look well overall. - I am not concerned about any significant medical problems causing her symptoms.  It could potentially be anxiety related or you could have a mild arrhythmia such as premature ventricular contractions that come and go which can be associated with stress, caffeine consumption, sleep, etc.  If this happens again you may talk to your primary care provider for referral to cardiology as you might consider wearing a Holter monitor to see if you are having any arrhythmias. - In the meantime I will go home and rest, increase fluids and try to reduce her stress and relax today. - If you have any associated chest pain, persistent palpitations, shortness of breath, dizziness or you feel you are going to pass out, vomiting, sweats, weakness, call 911 or have someone take you to the ER for further workup.

## 2023-04-25 NOTE — ED Triage Notes (Signed)
dizzy,sob, elevated bp, heartrate was irregular earlier today patient states that she feels better but still feels jittery. Patient has a hx of anxiety. Doesn't take meds. No hx of panic attacks.

## 2023-04-25 NOTE — ED Provider Notes (Signed)
[facility]    CSN: [id number] Arrival date & time: 04/25/23  0920      History   Chief Complaint Chief Complaint  Patient presents with   Shortness of Breath   Dizziness    HPI Kimberly Randolph is a 32 y.o. female.   Patient reports feeling odd when driving to work this morning. Reports feeling jittery with mild dizziness and having "difficulty taking a deep breath" about 2 hours ago.   Patient is a high school Engineer, site. Patient reports a nurse at work this morning told her that her BP was high and her heart rate would speed up and slow down. Patient denies ever feeling palpitations but says heart rate was elevated and BP was in the 150s systolic when checked at work this morning.  Reports the jitteriness has gotten better. She says she still doesn't feel right, but cannot really describe what she means by that. Denies chest pain, palpitations, headaches, vomiting, numbness/tingling, abdominal pain, shortness of breath.   Denies similar symptoms in the past. No history of any major medical issues. Has anxiety with denies taking treatment for it.  She denies feeling particularly anxious today.  Patient does report drinking caffeine and thinks she had a little bit less than normal today.  No routine meds taken.  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 08/12/2019   Excessive fetal growth affecting management of mother in third trimester, antepartum 08/12/2019   Fourth degree perineal laceration during delivery 10/05/2015   Heart murmur    History of delivery of macrosomal infant 08/12/2019    Patient Active Problem List   Diagnosis Date Noted   IUD (intrauterine device) in place 10/14/2019    Past Surgical History:  Procedure Laterality Date   IUD INSERTION  10/13/2019   WISDOM TOOTH EXTRACTION      OB History     Gravida  2   Para  2   Term  2   Preterm      AB      Living  2      SAB       IAB      Ectopic      Multiple  0   Live Births  2            Home Medications    Prior to Admission medications   Medication Sig Start Date End Date Taking? Authorizing Provider  Cetirizine HCl (ZYRTEC PO) Take by mouth.   Yes [provider]  acetaminophen (TYLENOL) 325 MG tablet Take 2 tablets (650 mg total) by mouth every 4 (four) hours as needed for mild pain (for pain scale < 4). 09/04/19   Jackelyn Poling, DO  ibuprofen (ADVIL) 600 MG tablet Take 1 tablet (600 mg total) by mouth every 6 (six) hours. 09/04/19   Jackelyn Poling, DO  levonorgestrel (MIRENA) 20 MCG/24HR IUD 1 each by Intrauterine route once.    [provider]  Misc. Devices (MEDELA DOUBLE BREAST PUMP) MISC 1 Units by Does not apply route as needed. Patient not taking: Reported on 08/28/2019 07/15/19   Reva Bores, MD  Prenatal Vit-Fe Fumarate-FA (PRENATAL VITAMINS PO) Take by mouth.    [provider]    Family History Family History  Problem Relation Age of Onset   Cancer Mother 52       Colon Cancer   Cancer Father 40       Bladder  Diabetes Father        Type 2   Hypertension Father    Birth defects Sister infancy       Heart valve defect   Diabetes Paternal Grandmother    Cancer Paternal Grandfather        Pancreatic    Social History Social History   Tobacco Use   Smoking status: Never   Smokeless tobacco: Never  Vaping Use   Vaping status: Never Used  Substance Use Topics   Alcohol use: No   Drug use: No     Allergies   Penicillins   Review of Systems Review of Systems  Constitutional:  Negative for fatigue.  Eyes:  Negative for visual disturbance.  Respiratory:  Negative for chest tightness, shortness of breath and wheezing.        +"difficulty taking deep breath."  Cardiovascular:  Negative for chest pain and palpitations.  Gastrointestinal:  Positive for nausea. Negative for abdominal pain and vomiting.  Musculoskeletal:  Negative for back  pain.  Neurological:  Positive for dizziness. Negative for syncope, weakness, numbness and headaches.  Psychiatric/Behavioral:  The patient is nervous/anxious.      Physical Exam Triage Vital Signs ED Triage Vitals  Encounter Vitals Group     BP 04/25/23 1002 113/78     Systolic BP Percentile --      Diastolic BP Percentile --      Pulse Rate 04/25/23 1002 78     Resp 04/25/23 1002 18     Temp 04/25/23 1002 98.2 F (36.8 C)     Temp Source 04/25/23 1002 Oral     SpO2 04/25/23 1002 95 %     Weight --      Height --      Head Circumference --      Peak Flow --      Pain Score 04/25/23 1000 0     Pain Loc --      Pain Education --      Exclude from Growth Chart --    No data found.  Updated Vital Signs BP 113/78 (BP Location: Right Arm)   Pulse 78   Temp 98.2 F (36.8 C) (Oral)   Resp 18   SpO2 95%      Physical Exam Vitals and nursing note reviewed.  Constitutional:      General: She is not in acute distress.    Appearance: Normal appearance. She is not ill-appearing or toxic-appearing.  HENT:     Head: Normocephalic and atraumatic.     Nose: Nose normal.     Mouth/Throat:     Mouth: Mucous membranes are moist.     Pharynx: Oropharynx is clear.  Eyes:     General: No scleral icterus.       Right eye: No discharge.        Left eye: No discharge.     Conjunctiva/sclera: Conjunctivae normal.  Cardiovascular:     Rate and Rhythm: Normal rate and regular rhythm.     Heart sounds: Normal heart sounds.  Pulmonary:     Effort: Pulmonary effort is normal. No respiratory distress.     Breath sounds: Normal breath sounds.  Musculoskeletal:     Cervical back: Neck supple.  Skin:    General: Skin is dry.  Neurological:     General: No focal deficit present.     Mental Status: She is alert and oriented to person, place, and time. Mental status is at baseline.  Motor: No weakness.     Gait: Gait normal.  Psychiatric:        Mood and Affect: Mood normal.         Behavior: Behavior normal.        Thought Content: Thought content normal.      UC Treatments / Results  Labs (all labs ordered are listed, but only abnormal results are displayed) Labs Reviewed - No data to display  EKG   Radiology No results found.  Procedures ED EKG  Date/Time: 04/25/2023 10:13 AM  Performed by: Shirlee Latch, PA-C Authorized by: Shirlee Latch, PA-C   Interpretation:    Interpretation: normal   Rate:    ECG rate:  76   ECG rate assessment: normal   Rhythm:    Rhythm: sinus rhythm   Ectopy:    Ectopy: none   QRS:    QRS axis:  Normal   QRS intervals:  Normal   QRS conduction: normal   ST segments:    ST segments:  Normal T waves:    T waves: normal   Comments:     Normal sinus rhythm. Regular rate.  (including critical care time)  Medications Ordered in UC Medications - No data to display  Initial Impression / Assessment and Plan / UC Course  I have reviewed the triage vital signs and the nursing notes.  Pertinent labs & imaging results that were available during my care of the patient were reviewed by me and considered in my medical decision making (see chart for details).   32 year old female with history of untreated anxiety presents for feeling off this morning when driving to work about 2 hours ago.  Works as a Psychologist, forensic.  Reports feeling jittery, shaky, dizzy and nauseous.  States when she got to work the nurse checked her blood pressure which was in the 150s systolic and was also told that her heart rate was going up and down.  Patient reports she is feeling better at this time but still feels a bit off.  Denies chest pain, palpitations, shortness of breath but says its "difficult taking a deep breath."  Vitals are normal and stable and the patient is overall well-appearing.  On exam she does not appear anxious.  Heart regular rate and rhythm and chest clear to auscultation.  An EKG performed today shows normal sinus  rhythm with a regular rate.  Advised patient it is possible her symptoms could be related to anxiety or possibly an arrhythmia that is not showing up on the EKG today.  Advised rest and fluids at this time.  Advised to try to manage anxiety that she can.  Might need to be on medication if this occurs again.  Advised if this does occur again she needs to be seen.  If it is ever associated with chest pain, presyncope/syncope, shortness of breath, weakness she should call 911 or have someone take her to the ER.  Encourage patient to make a follow-up appoint with her primary care provider.   Final Clinical Impressions(s) / UC Diagnoses   Final diagnoses:  Dizziness  Difficulty breathing  Anxiety     Discharge Instructions      -Your EKG looks perfect. - Your vital signs are all normal.  On exam your heart rhythm is regular and at a normal rate.  You look well overall. - I am not concerned about any significant medical problems causing her symptoms.  It could potentially be anxiety related or  you could have a mild arrhythmia such as premature ventricular contractions that come and go which can be associated with stress, caffeine consumption, sleep, etc.  If this happens again you may talk to your primary care provider for referral to cardiology as you might consider wearing a Holter monitor to see if you are having any arrhythmias. - In the meantime I will go home and rest, increase fluids and try to reduce her stress and relax today. - If you have any associated chest pain, persistent palpitations, shortness of breath, dizziness or you feel you are going to pass out, vomiting, sweats, weakness, call 911 or have someone take you to the ER for further workup.     ED Prescriptions   None    PDMP not reviewed this encounter.   Shirlee Latch, PA-C 04/25/23 1040
